# Patient Record
Sex: Male | Born: 1970 | Race: White | Hispanic: No | Marital: Married | Smoking: Current every day smoker
Health system: Southern US, Community
[De-identification: ages and names within clinical notes are randomized; demographics above are authoritative.]

## PROBLEM LIST (undated history)

## (undated) DIAGNOSIS — I4819 Other persistent atrial fibrillation: Secondary | ICD-10-CM

## (undated) DIAGNOSIS — I5022 Chronic systolic (congestive) heart failure: Secondary | ICD-10-CM

## (undated) DIAGNOSIS — E669 Obesity, unspecified: Secondary | ICD-10-CM

## (undated) DIAGNOSIS — F10231 Alcohol dependence with withdrawal delirium: Secondary | ICD-10-CM

## (undated) DIAGNOSIS — F1010 Alcohol abuse, uncomplicated: Secondary | ICD-10-CM

---

## 2015-07-27 ENCOUNTER — Ambulatory Visit
Admission: RE | Admit: 2015-07-27 | Discharge: 2015-07-27 | Disposition: A | Discharge status: Home / Self Care | Payer: Self-pay | Source: Ambulatory Visit | Attending: Cardiology | Admitting: Cardiology

## 2015-07-27 ENCOUNTER — Other Ambulatory Visit: Payer: Self-pay | Admitting: Cardiology

## 2015-07-27 DIAGNOSIS — R0602 Shortness of breath: Secondary | ICD-10-CM

## 2015-07-28 ENCOUNTER — Emergency Department (HOSPITAL_COMMUNITY): Payer: Managed Care, Other (non HMO)

## 2015-07-28 ENCOUNTER — Inpatient Hospital Stay (HOSPITAL_COMMUNITY)
Admission: EM | Admit: 2015-07-28 | Discharge: 2015-07-30 | DRG: 292 | Disposition: A | Discharge status: Home / Self Care | Payer: Managed Care, Other (non HMO) | Attending: Cardiology | Admitting: Cardiology

## 2015-07-28 ENCOUNTER — Encounter (HOSPITAL_COMMUNITY): Payer: Self-pay | Admitting: Emergency Medicine

## 2015-07-28 DIAGNOSIS — I429 Cardiomyopathy, unspecified: Secondary | ICD-10-CM | POA: Diagnosis present

## 2015-07-28 DIAGNOSIS — Z7901 Long term (current) use of anticoagulants: Secondary | ICD-10-CM | POA: Diagnosis not present

## 2015-07-28 DIAGNOSIS — I5023 Acute on chronic systolic (congestive) heart failure: Principal | ICD-10-CM | POA: Diagnosis present

## 2015-07-28 DIAGNOSIS — R000 Tachycardia, unspecified: Secondary | ICD-10-CM | POA: Diagnosis present

## 2015-07-28 DIAGNOSIS — G4700 Insomnia, unspecified: Secondary | ICD-10-CM | POA: Diagnosis present

## 2015-07-28 DIAGNOSIS — F1010 Alcohol abuse, uncomplicated: Secondary | ICD-10-CM | POA: Diagnosis present

## 2015-07-28 DIAGNOSIS — Z9114 Patient's other noncompliance with medication regimen: Secondary | ICD-10-CM

## 2015-07-28 DIAGNOSIS — E669 Obesity, unspecified: Secondary | ICD-10-CM | POA: Diagnosis present

## 2015-07-28 DIAGNOSIS — I481 Persistent atrial fibrillation: Secondary | ICD-10-CM | POA: Diagnosis present

## 2015-07-28 DIAGNOSIS — Z79899 Other long term (current) drug therapy: Secondary | ICD-10-CM | POA: Diagnosis not present

## 2015-07-28 DIAGNOSIS — Z6830 Body mass index (BMI) 30.0-30.9, adult: Secondary | ICD-10-CM | POA: Diagnosis not present

## 2015-07-28 DIAGNOSIS — F17210 Nicotine dependence, cigarettes, uncomplicated: Secondary | ICD-10-CM | POA: Diagnosis present

## 2015-07-28 DIAGNOSIS — I340 Nonrheumatic mitral (valve) insufficiency: Secondary | ICD-10-CM | POA: Diagnosis present

## 2015-07-28 DIAGNOSIS — I5022 Chronic systolic (congestive) heart failure: Secondary | ICD-10-CM | POA: Diagnosis present

## 2015-07-28 DIAGNOSIS — I4819 Other persistent atrial fibrillation: Secondary | ICD-10-CM | POA: Diagnosis present

## 2015-07-28 DIAGNOSIS — I509 Heart failure, unspecified: Secondary | ICD-10-CM

## 2015-07-28 DIAGNOSIS — K8020 Calculus of gallbladder without cholecystitis without obstruction: Secondary | ICD-10-CM | POA: Diagnosis present

## 2015-07-28 HISTORY — DX: Other persistent atrial fibrillation: I48.19

## 2015-07-28 HISTORY — DX: Obesity, unspecified: E66.9

## 2015-07-28 HISTORY — DX: Chronic systolic (congestive) heart failure: I50.22

## 2015-07-28 LAB — CBC
HCT: 46 % (ref 39.0–52.0)
HEMOGLOBIN: 15.4 g/dL (ref 13.0–17.0)
MCH: 31.1 pg (ref 26.0–34.0)
MCHC: 33.5 g/dL (ref 30.0–36.0)
MCV: 92.9 fL (ref 78.0–100.0)
PLATELETS: 188 10*3/uL (ref 150–400)
RBC: 4.95 MIL/uL (ref 4.22–5.81)
RDW: 13.4 % (ref 11.5–15.5)
WBC: 9.9 10*3/uL (ref 4.0–10.5)

## 2015-07-28 LAB — BASIC METABOLIC PANEL
ANION GAP: 14 (ref 5–15)
BUN: 8 mg/dL (ref 6–20)
CHLORIDE: 104 mmol/L (ref 101–111)
CO2: 20 mmol/L — ABNORMAL LOW (ref 22–32)
Calcium: 9.4 mg/dL (ref 8.9–10.3)
Creatinine, Ser: 1.03 mg/dL (ref 0.61–1.24)
Glucose, Bld: 104 mg/dL — ABNORMAL HIGH (ref 65–99)
POTASSIUM: 4.1 mmol/L (ref 3.5–5.1)
SODIUM: 138 mmol/L (ref 135–145)

## 2015-07-28 LAB — PROTIME-INR
INR: 1.73 — ABNORMAL HIGH (ref 0.00–1.49)
PROTHROMBIN TIME: 20.3 s — AB (ref 11.6–15.2)

## 2015-07-28 LAB — HEPATIC FUNCTION PANEL
ALBUMIN: 3.9 g/dL (ref 3.5–5.0)
ALT: 32 U/L (ref 17–63)
AST: 35 U/L (ref 15–41)
Alkaline Phosphatase: 69 U/L (ref 38–126)
BILIRUBIN DIRECT: 1 mg/dL — AB (ref 0.1–0.5)
BILIRUBIN TOTAL: 3.1 mg/dL — AB (ref 0.3–1.2)
Indirect Bilirubin: 2.1 mg/dL — ABNORMAL HIGH (ref 0.3–0.9)
Total Protein: 6.7 g/dL (ref 6.5–8.1)

## 2015-07-28 LAB — LIPASE, BLOOD: LIPASE: 21 U/L (ref 11–51)

## 2015-07-28 LAB — I-STAT TROPONIN, ED: TROPONIN I, POC: 0.03 ng/mL (ref 0.00–0.08)

## 2015-07-28 LAB — BRAIN NATRIURETIC PEPTIDE: B NATRIURETIC PEPTIDE 5: 1557.9 pg/mL — AB (ref 0.0–100.0)

## 2015-07-28 MED ORDER — MORPHINE SULFATE (PF) 4 MG/ML IV SOLN
4.0000 mg | Freq: Once | INTRAVENOUS | Status: AC
  Administered 2015-07-28: 4 mg via INTRAVENOUS
  Filled 2015-07-28: qty 1

## 2015-07-28 MED ORDER — IOPAMIDOL (ISOVUE-300) INJECTION 61%
INTRAVENOUS | Status: AC
  Administered 2015-07-28: 100 mL via INTRAVENOUS
  Filled 2015-07-28: qty 100

## 2015-07-28 MED ORDER — LISINOPRIL 2.5 MG PO TABS
2.5000 mg | ORAL_TABLET | Freq: Every day | ORAL | Status: DC
  Administered 2015-07-28 – 2015-07-29 (×2): 2.5 mg via ORAL
  Filled 2015-07-28 (×2): qty 1

## 2015-07-28 MED ORDER — DEXTROSE 5 % IV SOLN
5.0000 mg/h | INTRAVENOUS | Status: DC
  Administered 2015-07-28: 10 mg/h via INTRAVENOUS
  Filled 2015-07-28: qty 100

## 2015-07-28 MED ORDER — IOPAMIDOL (ISOVUE-300) INJECTION 61%
INTRAVENOUS | Status: AC
  Filled 2015-07-28: qty 75

## 2015-07-28 MED ORDER — FUROSEMIDE 10 MG/ML IJ SOLN
40.0000 mg | Freq: Once | INTRAMUSCULAR | Status: AC
  Administered 2015-07-28: 40 mg via INTRAVENOUS
  Filled 2015-07-28: qty 4

## 2015-07-28 MED ORDER — MORPHINE SULFATE (PF) 2 MG/ML IV SOLN: 2.0000 mg | Freq: Once | INTRAVENOUS | Status: DC

## 2015-07-28 MED ORDER — ONDANSETRON HCL 4 MG/2ML IJ SOLN
4.0000 mg | Freq: Once | INTRAMUSCULAR | Status: AC
  Administered 2015-07-28: 4 mg via INTRAVENOUS
  Filled 2015-07-28: qty 2

## 2015-07-28 MED ORDER — FUROSEMIDE 10 MG/ML IJ SOLN
40.0000 mg | Freq: Two times a day (BID) | INTRAMUSCULAR | Status: DC
  Administered 2015-07-28 – 2015-07-30 (×4): 40 mg via INTRAVENOUS
  Filled 2015-07-28 (×4): qty 4

## 2015-07-28 MED ORDER — ZOLPIDEM TARTRATE 5 MG PO TABS
5.0000 mg | ORAL_TABLET | Freq: Every evening | ORAL | Status: DC | PRN
  Administered 2015-07-28 – 2015-07-29 (×2): 5 mg via ORAL
  Filled 2015-07-28 (×2): qty 1

## 2015-07-28 MED ORDER — ACETAMINOPHEN 325 MG PO TABS: 650.0000 mg | ORAL_TABLET | ORAL | Status: DC | PRN

## 2015-07-28 MED ORDER — ONDANSETRON HCL 4 MG/2ML IJ SOLN: 4.0000 mg | Freq: Four times a day (QID) | INTRAMUSCULAR | Status: DC | PRN

## 2015-07-28 MED ORDER — METOPROLOL TARTRATE 25 MG PO TABS
25.0000 mg | ORAL_TABLET | Freq: Three times a day (TID) | ORAL | Status: DC
  Administered 2015-07-28 – 2015-07-30 (×5): 25 mg via ORAL
  Filled 2015-07-28 (×5): qty 1

## 2015-07-28 MED ORDER — DILTIAZEM LOAD VIA INFUSION
20.0000 mg | Freq: Once | INTRAVENOUS | Status: AC
  Administered 2015-07-28: 20 mg via INTRAVENOUS
  Filled 2015-07-28: qty 20

## 2015-07-28 MED ORDER — RIVAROXABAN 20 MG PO TABS
20.0000 mg | ORAL_TABLET | Freq: Every day | ORAL | Status: DC
  Administered 2015-07-28 – 2015-07-30 (×3): 20 mg via ORAL
  Filled 2015-07-28 (×3): qty 1

## 2015-07-28 MED ORDER — ONDANSETRON 4 MG PO TBDP: 8.0000 mg | ORAL_TABLET | Freq: Once | ORAL | Status: DC

## 2015-07-28 NOTE — ED Notes (Signed)
Cardiology PA at bedside for assesment

## 2015-07-28 NOTE — ED Notes (Signed)
returned from CT. Denies pain

## 2015-07-28 NOTE — ED Notes (Signed)
Franky Macho PA here and identified that patient followed by Dr. Bella Kennedy PA notified cardiologist

## 2015-07-28 NOTE — ED Notes (Signed)
Pt sts SOB and lower abd pain; pt sts SOB worse when laying down; pt sts hx of afib and sts takes meds for same

## 2015-07-28 NOTE — H&P (Signed)
History and Physical   Admit date: 07/28/2015 Name:  Juan Mathews Medical record number: [id number] DOB/Age:  1970-04-13  45 y.o. male  Referring Physician:   [facility]   Primary Cardiologist:  Donnie Aho  Primary Physician:   Dr. Leonides Sake  Chief complaint/reason for admission: Worsening shortness of breath  HPI:  This 45 year old male recently established with me for cardiac care.  I have really only seen him on one occasion previously.  He was living in [place] and was hospitalized there in May 2016.  He was found to have a nonischemic cardiomyopathy and rapid atrial fibrillation.  Cardiac catheterization in May 2016 did not show any significant coronary artery disease.  He was treated with low-dose lisinopril and carvedilol and placed on Xarelto there.  He was started on amiodarone with the idea that he might be cardioverted but felt bad after taking a dose or 2 and quit taking it.  He was separated from his wife and then decided to move back to [place] and discontinued all of his medications.  He most recently was seen for a physical and decided to go back on his medicines and went back on Xarelto about 10 days ago.  I saw him in the office on the 10th at which time he was in atrial fibrillation with rapid response.  I recommended a chest x-ray and lab and scheduled him for an echo but he did not get the lab work done until yesterday and did not start taking a higher dose of beta blocker which I had recommended.  He continued on his other medication and over the past few days has been accompanied progressively more short of breath.  He developed PND orthopnea and worsening edema with as well as shortness of breath with activity.  A note was made of fairly heavy alcohol abuse and use in [place] but he states that he has cut back since he has been here.   Past Medical History  Diagnosis Date  . Atrial fibrillation (HCC)   . CHF (congestive heart failure) (HCC)   .  Chronic systolic CHF (congestive heart failure) (HCC) 07/28/2015  . Persistent atrial fibrillation (HCC) 07/28/2015    Diagnosed 2016 in [place] went off treatment and moved here  CHA2DS2VASC score 1    . Obesity (BMI 30-39.9)      History reviewed. No pertinent past surgical history..  Allergies: has No Known Allergies.   Medications: Prior to Admission medications   Medication Sig Start Date End Date Taking? Authorizing Provider  carvedilol (COREG) 3.125 MG tablet Take 3.125 mg by mouth 2 (two) times daily with a meal.   Yes Historical Provider, MD  DiphenhydrAMINE HCl, Sleep, (ZZZQUIL) 25 MG CAPS Take 25 mg by mouth at bedtime as needed (sleep).   Yes Historical Provider, MD  diphenhydramine-acetaminophen (TYLENOL PM) 25-500 MG TABS tablet Take 1 tablet by mouth at bedtime as needed (sleep).   Yes Historical Provider, MD  lisinopril (PRINIVIL,ZESTRIL) 2.5 MG tablet Take 2.5 mg by mouth daily. 07/19/15  Yes Historical Provider, MD  XARELTO 20 MG TABS tablet Take 20 mg by mouth daily. 07/19/15  Yes Historical Provider, MD  zolpidem (AMBIEN) 10 MG tablet Take 10 mg by mouth at bedtime as needed. sleep 07/07/15  Yes Historical Provider, MD   Family History:  Family Status  Relation Status Death Age  . Father Alive   . Mother Alive   . Brother Alive     Social History:   reports that  he has been smoking.  He does not have any smokeless tobacco history on file. He reports that he drinks alcohol. He reports that he uses illicit drugs (Marijuana).   He is currently separated from his wife who lives in [place].  He works for [employer] in [employer].   Review of Systems: Difficult anxiety and stress.  He has drank fairly heavily in the past.  There is no history of GI bleeding or bleeding.  He has significant insomnia and has been using Ambien.  He does not have any significant arthritis. Other than as noted above, the remainder of the review of systems is normal  Physical  Exam: BP 133/105 mmHg  Pulse 73  Temp(Src) 97.3 F (36.3 C) (Oral)  Resp 13  SpO2 91% General appearance: Pleasant male currently in no acute distress Head: Normocephalic, without obvious abnormality, atraumatic Eyes: conjunctivae/corneas clear. PERRL, EOM's intact. Fundi not examined  Neck: no adenopathy, no carotid bruit, supple, symmetrical, trachea midline, thyroid not enlarged, symmetric, no tenderness/mass/nodules and JVD is elevated at 45 Lungs: Reduced breath sounds at the base Heart: Rapid irregular rhythm, normal S1 and S2, no S3, no murmur Abdomen: soft, non-tender; bowel sounds normal; no masses,  no organomegaly Rectal: deferred Extremities: 2+ peripheral edema, atraumatic, normal strength and tone Pulses: 2+ and symmetric Skin: Skin color, texture, turgor normal. No rashes or lesions Neurologic: Grossly normal  Labs: CBC  Recent Labs  07/28/15 0803  WBC 9.9  RBC 4.95  HGB 15.4  HCT 46.0  PLT 188  MCV 92.9  MCH 31.1  MCHC 33.5  RDW 13.4   CMP   Recent Labs  07/28/15 0803 07/28/15 0835  NA 138  --   K 4.1  --   CL 104  --   CO2 20*  --   GLUCOSE 104*  --   BUN 8  --   CREATININE 1.03  --   CALCIUM 9.4  --   PROT  --  6.7  ALBUMIN  --  3.9  AST  --  35  ALT  --  32  ALKPHOS  --  69  BILITOT  --  3.1*  GFRNONAA >60  --   GFRAA >60  --    BNP (last 3 results)    Component Value Date/Time   BNP 1557.9* 07/28/2015 0835   Cardiac Panel (last 3 results) Troponin (Point of Care Test)  Recent Labs  07/28/15 0802  TROPIPOC 0.03   EKG: Atrial fibrillation with rapid ventricular response  Radiology: Cardiomegaly without edema   IMPRESSIONS: 1.  Rapid atrial fibrillation 2.  Acute on chronic systolic congestive heart failure 3.  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  Medical noncompliance  PLAN: The patient had recently reestablished for care.  He has worsening heart failure without obvious  change and his recent medications.  He will need to have rate control and a repeat echo which has not been done yet.  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  We'll await echocardiogram.  Continue Xarelto.  I will try to rate control him and it may be an early TEE cardioversion or possibly Tikosyn may help.  He ultimately may be a candidate for catheter ablation for atrial fibrillation.  Signed: Darden Palmer MD [facility] Cardiology  07/28/2015, 3:34 PM

## 2015-07-28 NOTE — ED Notes (Signed)
Patient transported to CT 

## 2015-07-28 NOTE — Progress Notes (Signed)
New pt admission from ED. Pt brought to the floor in stable condition. Vitals taken. Initial Assessment done. All immediate pertinent needs to patient addressed. Patient Guide given to patient. Important safety instructions relating to hospitalization reviewed with patient. Patient verbalized understanding. Will continue to monitor pt.  Shai Rasmussen RN 

## 2015-07-28 NOTE — ED Provider Notes (Signed)
CSN: [id number]     Arrival date & time 07/28/15  0745 History   First MD Initiated Contact with Patient 07/28/15 [phone]     Chief Complaint  Patient presents with  . Shortness of Breath  . Palpitations  . Abdominal Pain    (Consider location/radiation/quality/duration/timing/severity/associated sxs/prior Treatment) Patient is a 45 y.o. male presenting with shortness of breath, palpitations, and abdominal pain. The history is provided by the patient.  Shortness of Breath Associated symptoms: abdominal pain   Associated symptoms: no cough, no diaphoresis, no rash and no wheezing   Palpitations Associated symptoms: shortness of breath   Associated symptoms: no cough, no diaphoresis and no dizziness   Abdominal Pain Associated symptoms: shortness of breath   Associated symptoms: no cough    45 y/o man with Hx of atrial fibrillation, heart failure, and tobacco use presents with shortness of breath and abdominal pain. His shortness of breath became severe enough that he could not sleep at all last night prompting him to come for evaluation. Lying flat causes this worsen greatly. He also notices increasing leg swelling and becomes short of breath with exertion over the past month. He is taking lisinopril and coreg for heart failure but has been off his xarelto for afib for 3-4 days due to running out of the medicine. He continue to drink alcohol regularly. He has been noticing intermittent diffuse abdominal pain and bloating for the month prior to this event but now acutely worsened. He also had several loose diarrheal stools up to 10 times this week. He denies any blood or abnormal color in these stools. He does not weigh himself regularly and know if there has been a significant change.  Past Medical History  Diagnosis Date  . Atrial fibrillation (HCC)   . CHF (congestive heart failure) (HCC)    History reviewed. No pertinent past surgical history. History reviewed. No pertinent family  history. Social History  Substance Use Topics  . Smoking status: Current Every Day Smoker  . Smokeless tobacco: None  . Alcohol Use: Yes    Review of Systems  Constitutional: Negative for diaphoresis.  HENT: Negative for congestion.   Respiratory: Positive for shortness of breath. Negative for cough and wheezing.   Cardiovascular: Positive for palpitations.  Gastrointestinal: Positive for abdominal pain.  Endocrine: Negative for polyuria.  Genitourinary: Negative for difficulty urinating.  Skin: Negative for rash.  Neurological: Negative for dizziness and light-headedness.  Psychiatric/Behavioral: The patient is not nervous/anxious.       Allergies  Review of patient's allergies indicates no known allergies.  Home Medications   Prior to Admission medications   Medication Sig Start Date End Date Taking? Authorizing Provider  carvedilol (COREG) 3.125 MG tablet Take 3.125 mg by mouth 2 (two) times daily with a meal.   Yes Historical Provider, MD  DiphenhydrAMINE HCl, Sleep, (ZZZQUIL) 25 MG CAPS Take 25 mg by mouth at bedtime as needed (sleep).   Yes Historical Provider, MD  diphenhydramine-acetaminophen (TYLENOL PM) 25-500 MG TABS tablet Take 1 tablet by mouth at bedtime as needed (sleep).   Yes Historical Provider, MD  lisinopril (PRINIVIL,ZESTRIL) 2.5 MG tablet Take 2.5 mg by mouth daily. 07/19/15  Yes Historical Provider, MD  XARELTO 20 MG TABS tablet Take 20 mg by mouth daily. 07/19/15  Yes Historical Provider, MD  zolpidem (AMBIEN) 10 MG tablet Take 10 mg by mouth at bedtime as needed. sleep 07/07/15  Yes Historical Provider, MD   BP 133/105 mmHg  Pulse 73  Temp(Src) 97.3  F (36.3 C) (Oral)  Resp 13  SpO2 91% Physical Exam  Constitutional: He is oriented to person, place, and time. He appears well-developed and well-nourished. No distress.  HENT:  Head: Atraumatic.  Mouth/Throat: Oropharynx is clear and moist.  Neck: JVD present.  Cardiovascular: An irregular rhythm  present. Tachycardia present.   Pulmonary/Chest: Breath sounds normal. No respiratory distress. He exhibits no tenderness.  Abdominal: Soft. He exhibits no distension. There is tenderness.  Musculoskeletal:  2+ pitting pedal edema b/l  Neurological: He is alert and oriented to person, place, and time.  Skin: Skin is warm and dry. He is not diaphoretic.    ED Course  Procedures (including critical care time) Labs Review Labs Reviewed  BASIC METABOLIC PANEL - Abnormal; Notable for the following:    CO2 20 (*)    Glucose, Bld 104 (*)    All other components within normal limits  HEPATIC FUNCTION PANEL - Abnormal; Notable for the following:    Total Bilirubin 3.1 (*)    Bilirubin, Direct 1.0 (*)    Indirect Bilirubin 2.1 (*)    All other components within normal limits  BRAIN NATRIURETIC PEPTIDE - Abnormal; Notable for the following:    B Natriuretic Peptide 1557.9 (*)    All other components within normal limits  PROTIME-INR - Abnormal; Notable for the following:    Prothrombin Time 20.3 (*)    INR 1.73 (*)    All other components within normal limits  CBC  LIPASE, BLOOD  I-STAT TROPOININ, ED    Imaging Review Dg Chest 2 View  07/28/2015  CLINICAL DATA:  Shortness of Breath EXAM: CHEST  2 VIEW COMPARISON:  July 27, 2015 FINDINGS: There is no edema or consolidation. There is mild cardiomegaly with pulmonary vascularity within normal limits. No adenopathy. No bone lesions. IMPRESSION: Stable cardiomegaly.  No edema or consolidation. Electronically Signed   By: Bretta Bang III M.D.   On: 07/28/2015 08:14   Dg Chest 2 View  07/27/2015  CLINICAL DATA:  Shortness of breath for several days. EXAM: CHEST  2 VIEW COMPARISON:  None. FINDINGS: Cardiomegaly with vascular congestion. No overt edema. No confluent opacities or effusions. No acute bony abnormality. IMPRESSION: Cardiomegaly with vascular congestion. Electronically Signed   By: Charlett Nose M.D.   On: 07/27/2015 11:20   Ct  Abdomen Pelvis W Contrast  07/28/2015  CLINICAL DATA:  45 year old male with 3 weeks of lower abdominal pain, diarrhea, nausea, abdominal distension. Unintentional weight gain. Elevated bilirubin. Initial encounter. EXAM: CT ABDOMEN AND PELVIS WITH CONTRAST TECHNIQUE: Multidetector CT imaging of the abdomen and pelvis was performed using the standard protocol following bolus administration of intravenous contrast. CONTRAST:  1 ISOVUE-300 IOPAMIDOL (ISOVUE-300) INJECTION 61% COMPARISON:  Chest radiographs from today and earlier. FINDINGS: Cardiomegaly. There is some reflux of right atrium contrast into a dilated hepatic IVC and hepatic veins. No pericardial effusion. Small to moderate layering right pleural effusion with mild compressive atelectasis. Minor dependent atelectasis at the left lung base. No acute osseous abnormality identified. Small volume of abdominal and pelvic free fluid with simple fluid densitometry. Mild flank and posterior body wall subcutaneous stranding compatible with subcutaneous edema. Nonspecific moderate bilateral perinephric and para renal space stranding not associated with hydronephrosis or hydroureter. Suboptimal portal venous phase of contrast on these images, but the main portal vein appears to remain patent. The IVC is mildly dilated throughout its course. Major arterial structures in the abdomen and pelvis are patent. Decompressed distal colon. There are venous  varices about the urinary bladder which otherwise appears normal. Decompressed sigmoid and left colon. Redundant but otherwise negative transverse colon. Negative right colon, decompressed terminal ileum. Appendix not delineated. No dilated small bowel. Decompressed stomach and duodenum. Decreased density throughout the liver in keeping with hepatic steatosis. No discrete liver lesion. Fatty gallstones. Fluid in the gallbladder fossa without definite gallbladder wall thickening. No intra or extrahepatic biliary ductal  enlargement. Normal splenic size. No splenic lesion. Negative pancreas and adrenal glands. Bilateral renal enhancement and contrast excretion is normal. No lymphadenopathy in the abdomen or pelvis. IMPRESSION: 1. A degree of anasarca for which cardiac versus hepatic etiology are considerations: Small to moderate layering right pleural effusion. Small volume abdominal and pelvic ascites. Body wall edema. 2. Cardiomegaly with no pericardial effusion. Some reflux of right atrium contrast into the IVC and hepatic veins may indicate a degree of right heart failure. 3. Hepatic steatosis, but no morphologic changes of cirrhosis. Consider acute hepatitis. Dilated IVC and pelvic veins with varices about the urinary bladder. 4. Bilateral perinephric stranding without evidence of renal obstruction suspicious for acute renal insufficiency. 5. Cholelithiasis. Pericholecystic fluid felt related to #1. No CT evidence of acute biliary obstruction. If acute cholecystitis is suspected clinically then right upper quadrant ultrasound versus nuclear medicine hepatobiliary scan would be recommended. Electronically Signed   By: Odessa Fleming M.D.   On: 07/28/2015 10:35   I have personally reviewed and evaluated these images and lab results as part of my medical decision-making.   EKG Interpretation   Date/Time:  Wednesday July 28 2015 07:49:08 EDT Ventricular Rate:  129 PR Interval:    QRS Duration: 80 QT Interval:  318 QTC Calculation: 465 R Axis:   0 Text Interpretation:  Atrial fibrillation with rapid ventricular response  Nonspecific T wave abnormality , probably digitalis effect Abnormal ECG No  old tracing to compare Confirmed by FLOYD MD, DANIEL [id number]) on 07/28/2015  7:55:26 AM Also confirmed by FLOYD MD, DANIEL [phone]), editor Dan Humphreys, CCT,  SANDRA ([id number])  on 07/28/2015 8:13:35 AM      MDM   Final diagnoses:  Acute on chronic congestive heart failure, unspecified congestive heart failure type (HCC)    Presentation in Afib with RVR with anasarca, JVD, and dyspnea consistent with heart failure exacerbation. BP is stable and he does not have chest pain. History, EKG, troponin not consistent with cardiac ischemia. Abdominal bloating and ascites is likely secondary to this same process, and he does not have significant hepatic dysfunction. CXR demonstrating vascular congestion and R sided pleural effusion. His heart rate responded quickly to IV diltiazem which was then discontinued in the ED. It is unclear why he has worsening heart failure and fluid retention over the past month. Case was discussed with Cardiology service and agree with plan for hospital admission.    Fuller Plan, MD 07/28/15 1502  Melene Plan, DO 07/28/15 8335

## 2015-07-28 NOTE — ED Notes (Addendum)
MD back at bedside for reassessment. HR now in 80's and cardizem drip stopped per MD request

## 2015-07-28 NOTE — ED Notes (Signed)
Attempted to call report to 3E, RN unavilable

## 2015-07-29 ENCOUNTER — Inpatient Hospital Stay (HOSPITAL_COMMUNITY): Payer: Managed Care, Other (non HMO)

## 2015-07-29 LAB — ECHOCARDIOGRAM COMPLETE
Height: 74 in
WEIGHTICAEL: 3860.8 [oz_av]

## 2015-07-29 LAB — BASIC METABOLIC PANEL
Anion gap: 12 (ref 5–15)
BUN: 6 mg/dL (ref 6–20)
CALCIUM: 8.9 mg/dL (ref 8.9–10.3)
CHLORIDE: 101 mmol/L (ref 101–111)
CO2: 26 mmol/L (ref 22–32)
CREATININE: 1.06 mg/dL (ref 0.61–1.24)
Glucose, Bld: 85 mg/dL (ref 65–99)
Potassium: 3.6 mmol/L (ref 3.5–5.1)
SODIUM: 139 mmol/L (ref 135–145)

## 2015-07-29 MED ORDER — PERFLUTREN LIPID MICROSPHERE
INTRAVENOUS | Status: AC
  Filled 2015-07-29: qty 10

## 2015-07-29 MED ORDER — SPIRONOLACTONE 25 MG PO TABS
25.0000 mg | ORAL_TABLET | Freq: Every day | ORAL | Status: DC
  Administered 2015-07-29 – 2015-07-30 (×2): 25 mg via ORAL
  Filled 2015-07-29 (×2): qty 1

## 2015-07-29 MED ORDER — PERFLUTREN LIPID MICROSPHERE
1.0000 mL | INTRAVENOUS | Status: AC | PRN
  Administered 2015-07-29: 2 mL via INTRAVENOUS
  Filled 2015-07-29: qty 10

## 2015-07-29 NOTE — Care Management Note (Signed)
Case Management Note  Patient Details  Name: Cashawn Kucera MRN: [id number] Date of Birth: 05-14-1970  Subjective/Objective:    Pt admitted with Acute on Chronic systolic heart failure                Action/Plan:  Pt is from home with girlfriend .  Pt on Xarelto PTA.  CM will continue to monitor for disposition needs   Expected Discharge Date:                  Expected Discharge Plan:  Home/Self Care  In-House Referral:     Discharge planning Services  CM Consult  Post Acute Care Choice:    Choice offered to:     DME Arranged:    DME Agency:     HH Arranged:    HH Agency:     Status of Service:  In process, will continue to follow  Medicare Important Message Given:    Date Medicare IM Given:    Medicare IM give by:    Date Additional Medicare IM Given:    Additional Medicare Important Message give by:     If discussed at Long Length of Stay Meetings, dates discussed:    Additional Comments:  Cherylann Parr, RN 07/29/2015, 2:25 PM

## 2015-07-29 NOTE — Progress Notes (Signed)
Subjective:  He has diuresed overnight and is feeling a lot better.  Weight is down 2 pounds.  Less shortness of breath since yesterday.  Was able to lay flat last night.  Objective:  Vital Signs in the last 24 hours: BP 127/77 mmHg  Pulse 80  Temp(Src) 97.6 F (36.4 C) (Oral)  Resp 18  Ht 6\' 2"  (1.88 m)  Wt 109.453 kg (241 lb 4.8 oz)  BMI 30.97 kg/m2  SpO2 96%  Physical Exam: Pleasant male in no acute distress Lungs:  Clear  Cardiac:  Regular rhythm, normal S1 and S2, no S3 Abdomen:  Soft, nontender, no masses Extremities:  1+ edema present  Intake/Output from previous day: 04/19 0701 - 04/20 0700 In: 240 [P.O.:240] Out: 3925 [Urine:3925] Weight Filed Weights   07/28/15 1700 07/29/15 0530  Weight: 110.36 kg (243 lb 4.8 oz) 109.453 kg (241 lb 4.8 oz)    Lab Results: Basic Metabolic Panel:  Recent Labs  11/22/46 0803 07/29/15 0233  NA 138 139  K 4.1 3.6  CL 104 101  CO2 20* 26  GLUCOSE 104* 85  BUN 8 6  CREATININE 1.03 1.06    CBC:  Recent Labs  07/28/15 0803  WBC 9.9  HGB 15.4  HCT 46.0  MCV 92.9  PLT 188    BNP    Component Value Date/Time   BNP 1557.9* 07/28/2015 0835    PROTIME: Lab Results  Component Value Date   INR 1.73* 07/28/2015    Telemetry: Atrial fibrillation rate is currently controlled  Assessment/Plan:  1.  Acute on chronic systolic congestive heart failure 2.  Nonischemic cardiomyopathy 3.  Persistent atrial fibrillation currently under better control 4.  Long-term use of anticoagulation  Recommendations:  Await echocardiogram done today.  Continue intravenous diuresis.  Add spironolactone.  At this point in leaning towards treatment of heart failure and completion of 4 weeks of anticoagulation prior to cardioversion.     Darden Palmer  MD [facility] Cardiology  07/29/2015, 9:11 AM

## 2015-07-29 NOTE — Progress Notes (Signed)
  Echocardiogram 2D Echocardiogram has been performed.  Juan Mathews 07/29/2015, 1:24 PM

## 2015-07-30 ENCOUNTER — Inpatient Hospital Stay (HOSPITAL_COMMUNITY): Payer: Managed Care, Other (non HMO)

## 2015-07-30 DIAGNOSIS — I481 Persistent atrial fibrillation: Secondary | ICD-10-CM

## 2015-07-30 DIAGNOSIS — I5023 Acute on chronic systolic (congestive) heart failure: Principal | ICD-10-CM

## 2015-07-30 LAB — BASIC METABOLIC PANEL
Anion gap: 12 (ref 5–15)
BUN: 6 mg/dL (ref 6–20)
CALCIUM: 9 mg/dL (ref 8.9–10.3)
CHLORIDE: 98 mmol/L — AB (ref 101–111)
CO2: 31 mmol/L (ref 22–32)
CREATININE: 1.04 mg/dL (ref 0.61–1.24)
Glucose, Bld: 138 mg/dL — ABNORMAL HIGH (ref 65–99)
Potassium: 3.2 mmol/L — ABNORMAL LOW (ref 3.5–5.1)
SODIUM: 141 mmol/L (ref 135–145)

## 2015-07-30 LAB — TSH: TSH: 1.562 u[IU]/mL (ref 0.350–4.500)

## 2015-07-30 MED ORDER — METOPROLOL SUCCINATE ER 50 MG PO TB24
50.0000 mg | ORAL_TABLET | Freq: Two times a day (BID) | ORAL | Status: DC
  Administered 2015-07-30: 50 mg via ORAL
  Filled 2015-07-30: qty 1

## 2015-07-30 MED ORDER — POTASSIUM CHLORIDE 20 MEQ/15ML (10%) PO SOLN
40.0000 meq | Freq: Once | ORAL | Status: AC
  Administered 2015-07-30: 40 meq via ORAL
  Filled 2015-07-30: qty 30

## 2015-07-30 MED ORDER — METOPROLOL SUCCINATE ER 25 MG PO TB24: 25.0000 mg | ORAL_TABLET | Freq: Two times a day (BID) | ORAL | Status: DC

## 2015-07-30 MED ORDER — SPIRONOLACTONE 25 MG PO TABS: 25.0000 mg | ORAL_TABLET | Freq: Every day | ORAL | Status: DC

## 2015-07-30 MED ORDER — FUROSEMIDE 40 MG PO TABS: 40.0000 mg | ORAL_TABLET | Freq: Every day | ORAL | Status: AC

## 2015-07-30 MED ORDER — FUROSEMIDE 40 MG PO TABS
40.0000 mg | ORAL_TABLET | Freq: Every day | ORAL | Status: DC
  Administered 2015-07-30: 40 mg via ORAL
  Filled 2015-07-30: qty 1

## 2015-07-30 MED ORDER — METOPROLOL SUCCINATE ER 50 MG PO TB24: 50.0000 mg | ORAL_TABLET | Freq: Two times a day (BID) | ORAL | Status: DC

## 2015-07-30 MED ORDER — LISINOPRIL 2.5 MG PO TABS
2.5000 mg | ORAL_TABLET | Freq: Every day | ORAL | Status: DC
  Administered 2015-07-30: 2.5 mg via ORAL
  Filled 2015-07-30: qty 1

## 2015-07-30 NOTE — Progress Notes (Signed)
Subjective:  He is clinically feeling a lot better.  Weight is now down 11 pounds.  No shortness of breath or chest pain.  Echo yesterday shows markedly dilated left atrium and dilated LV with an EF of 10-15% which is down by report from the echo done in [place] last year.  Again reviewed his history.  He drank a lot around the time of the first echo but has not had excessive alcohol use recently.  No longer has PND or orthopnea but normally has a lot of trouble with insomnia.  Objective:  Vital Signs in the last 24 hours: BP 108/84 mmHg  Pulse 88  Temp(Src) 97.9 F (36.6 C) (Oral)  Resp 17  Ht 6\' 2"  (1.88 m)  Wt 105.507 kg (232 lb 9.6 oz)  BMI 29.85 kg/m2  SpO2 96%  Physical Exam: Pleasant male in no acute distress Lungs:  Clear  Cardiac:  Regular rhythm, normal S1 and S2, no S3 Abdomen:  Soft, nontender, no masses Extremities: No edema present  Intake/Output from previous day: 04/20 0701 - 04/21 0700 In: 960 [P.O.:960] Out: 3450 [Urine:3450] Weight Filed Weights   07/28/15 1700 07/29/15 0530 07/30/15 0507  Weight: 110.36 kg (243 lb 4.8 oz) 109.453 kg (241 lb 4.8 oz) 105.507 kg (232 lb 9.6 oz)    Lab Results: Basic Metabolic Panel:  Recent Labs  62/37/62 0233 07/30/15 0357  NA 139 141  K 3.6 3.2*  CL 101 98*  CO2 26 31  GLUCOSE 85 138*  BUN 6 6  CREATININE 1.06 1.04    CBC:  Recent Labs  07/28/15 0803  WBC 9.9  HGB 15.4  HCT 46.0  MCV 92.9  PLT 188    BNP    Component Value Date/Time   BNP 1557.9* 07/28/2015 0835    PROTIME: Lab Results  Component Value Date   INR 1.73* 07/28/2015    Telemetry: Atrial fibrillation rate is currently controlled  Assessment/Plan:  1.  Acute on chronic systolic congestive heart failure clinically better with 11 pound diuresis 2.  Nonischemic cardiomyopathy 3.  Persistent atrial fibrillation currently under better control 4.  Long-term use of anticoagulation  Recommendations:  Very concerned about the  drop in ejection fraction even from last year.  He did go a period of time with no medications until recently and has only been back on Xarelto the past 10 days. Because of his age and the quite low EF am going to ask the advanced heart failure physicians to consult on him.  He is currently on low-dose lisinopril and this will need to be stopped so we can change him to [facility].  He is on metoprolol and we'll change this to the extended release form and also on spironolactone.He is anxious for discharge so I think we could start the interested was an outpatient.  With the size of his left atrium I'm not sure his chances of maintaining sinus rhythm long-term will be good.   Darden Palmer  MD [facility] Cardiology  07/30/2015, 7:54 AM

## 2015-07-30 NOTE — Progress Notes (Signed)
Patient is discharge to home accompanied by RN. Discharge instructions given . Patient verbalizes understanding. All personal belongings given. Telemetry box and IV removed prior to discharge and site in good condition.

## 2015-07-30 NOTE — Consult Note (Signed)
Advanced Heart Failure Team Consult Note  Referring Physician: Dr Juan Mathews  Primary Physician: Dr Juan Mathews Primary Cardiologist:  Dr Juan Mathews   Reason for Consultation: Heart Failure   HPI:    [name] is a 45 year old with a history of chronic systolic heart failure diagnosed May 2016, NICM had LHC in [place], A Fib, and former ETOH abuse (1 bottle of wine nightly cut back in 2016).  Relocated from [place] to [place] about 6 months ago. He has been off his medications for several months.   Over the last month he noticed increased dyspnea with exertion and fatigue. He was evaluated by PCP and xarelto was restarted and he was referred to Dr Juan Mathews. At that time he was started back on low dose ace and bb. Unfortunately he had progressive dyspnea on exertion so he presented to [facility] ED for further evaluation.   Admitted with increased dyspnea and A fib RVR.  He has been diuresing with IV lasix. Started on Toprol XL for rate control, xarelto, and Spiro. Overall he has been diuresed 11 pounds.   Overall he is feeling better. Denies SOB.     ECHO: EF 10-15% LA severely dilated. Mod MR RA moderately dilated. Severe Hypokinesis  SH: Does not smoke. Lives alone. 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: Negative for HF /CAD   Review of Systems: [y] = yes,  = no   General: Weight gain ; Weight loss ; Anorexia ; Fatigue [Y ]; Fever ; Chills ; Weakness [ Y]  Cardiac: Chest pain/pressure ; Resting SOB ; Exertional SOB [Y ]; Orthopnea ; Pedal Edema ; Palpitations ; Syncope ; Presyncope ; Paroxysmal nocturnal dyspnea[ ]   Pulmonary: Cough ; Wheezing[ ] ; Hemoptysis[ ] ; Sputum ; Snoring   GI: Vomiting[ ] ; Dysphagia[ ] ; Melena[ ] ; Hematochezia ; Heartburn[ ] ; Abdominal pain ; Constipation ; Diarrhea ; BRBPR   GU: Hematuria[ ] ; Dysuria ; Nocturia[ ]   Vascular: Pain in legs with walking ; Pain in feet with lying flat ; Non-healing sores ; Stroke ; TIA ;  Slurred speech ;  Neuro: Headaches[ ] ; Vertigo[ ] ; Seizures[ ] ; Paresthesias[ ] ;Blurred vision ; Diplopia ; Vision changes   Ortho/Skin: Arthritis ; Joint pain ; Muscle pain ; Joint swelling ; Back Pain ; Rash   Psych: Depression[ ] ; Anxiety[ ]   Heme: Bleeding problems ; Clotting disorders ; Anemia   Endocrine: Diabetes ; Thyroid dysfunction[ ]   Home Medications Prior to Admission medications   Medication Sig Start Date End Date Taking? Authorizing Provider  carvedilol (COREG) 3.125 MG tablet Take 3.125 mg by mouth 2 (two) times daily with a meal.   Yes Historical Provider, MD  DiphenhydrAMINE HCl, Sleep, (ZZZQUIL) 25 MG CAPS Take 25 mg by mouth at bedtime as needed (sleep).   Yes Historical Provider, MD  diphenhydramine-acetaminophen (TYLENOL PM) 25-500 MG TABS tablet Take 1 tablet by mouth at bedtime as needed (sleep).   Yes Historical Provider, MD  lisinopril (PRINIVIL,ZESTRIL) 2.5 MG tablet Take 2.5 mg by mouth daily. 07/19/15  Yes Historical Provider, MD  XARELTO 20 MG TABS tablet Take 20 mg by mouth daily. 07/19/15  Yes Historical Provider, MD  zolpidem (AMBIEN) 10 MG tablet Take 10 mg by mouth at bedtime as needed. sleep 07/07/15  Yes Historical Provider,  MD   Scheduled Meds: . furosemide  40 mg Oral Daily  . lisinopril  2.5 mg Oral Daily  . metoprolol succinate  50 mg Oral BID WC  . rivaroxaban  20 mg Oral Q supper  . spironolactone  25 mg Oral Daily   Continuous Infusions:  PRN Meds:.acetaminophen, ondansetron (ZOFRAN) IV, zolpidem   Past Medical History: Past Medical History  Diagnosis Date  . Atrial fibrillation (HCC)   . CHF (congestive heart failure) (HCC)   . Chronic systolic CHF (congestive heart failure) (HCC) 07/28/2015  . Persistent atrial fibrillation (HCC) 07/28/2015    Diagnosed 2016 in [place] went off treatment and moved here  CHA2DS2VASC score 1    . Obesity (BMI 30-39.9)     Past Surgical History: History  reviewed. No pertinent past surgical history.  Family History: History reviewed. No pertinent family history.  Social History: Social History   Social History  . Marital Status: Married    Spouse Name: N/A  . Number of Children: N/A  . Years of Education: N/A   Social History Main Topics  . Smoking status: Current Every Day Smoker  . Smokeless tobacco: None  . Alcohol Use: 0.0 oz/week    0 Standard drinks or equivalent per week     Comment: Heavy in [ast   . Drug Use: Yes    Special: Marijuana  . Sexual Activity: Not Asked   Other Topics Concern  . None   Social History Narrative  . None    Allergies:  No Known Allergies  Objective:    Vital Signs:   Temp:  [97.4 F (36.3 C)-97.9 F (36.6 C)] 97.9 F (36.6 C) (04/21 0507) Pulse Rate:  [67-95] 88 (04/21 0507) Resp:  [17-18] 17 (04/21 0507) BP: (101-121)/(68-86) 108/84 mmHg (04/21 0507) SpO2:  [93 %-97 %] 96 % (04/21 0507) Weight:  [232 lb 9.6 oz (105.507 kg)] 232 lb 9.6 oz (105.507 kg) (04/21 0507) Last BM Date: 07/28/15  Weight change: Filed Weights   07/28/15 1700 07/29/15 0530 07/30/15 0507  Weight: 243 lb 4.8 oz (110.36 kg) 241 lb 4.8 oz (109.453 kg) 232 lb 9.6 oz (105.507 kg)    Intake/Output:   Intake/Output Summary (Last 24 hours) at 07/30/15 1132 Last data filed at 07/30/15 0937  Gross per 24 hour  Intake   1560 ml  Output   3100 ml  Net  -1540 ml     Physical Exam: General:  Well appearing. No resp difficulty HEENT: normal Neck: supple. JVP 8-9 . Carotids 2+ bilat; no bruits. No lymphadenopathy or thryomegaly appreciated. Cor: PMI nondisplaced. Irregular Regular rate & rhythm. No rubs, gallops or murmurs. Lungs: clear Abdomen: soft, nontender, nondistended. No hepatosplenomegaly. No bruits or masses. Good bowel sounds. Extremities: no cyanosis, clubbing, rash, edema Neuro: alert & orientedx3, cranial nerves grossly intact. moves all 4 extremities w/o difficulty. Affect  pleasant  Telemetry: A fib 100s   Labs: Basic Metabolic Panel:  Recent Labs Lab 07/28/15 0803 07/29/15 0233 07/30/15 0357  NA 138 139 141  K 4.1 3.6 3.2*  CL 104 101 98*  CO2 20* 26 31  GLUCOSE 104* 85 138*  BUN 8 6 6   CREATININE 1.03 1.06 1.04  CALCIUM 9.4 8.9 9.0    Liver Function Tests:  Recent Labs Lab 07/28/15 0835  AST 35  ALT 32  ALKPHOS 69  BILITOT 3.1*  PROT 6.7  ALBUMIN 3.9    Recent Labs Lab 07/28/15 0835  LIPASE 21   No results for input(s):  AMMONIA in the last 168 hours.  CBC:  Recent Labs Lab 07/28/15 0803  WBC 9.9  HGB 15.4  HCT 46.0  MCV 92.9  PLT 188    Cardiac Enzymes: No results for input(s): CKTOTAL, CKMB, CKMBINDEX, TROPONINI in the last 168 hours.  BNP: BNP (last 3 results)  Recent Labs  07/28/15 0835  BNP 1557.9*    ProBNP (last 3 results) No results for input(s): PROBNP in the last 8760 hours.   CBG: No results for input(s): GLUCAP in the last 168 hours.  Coagulation Studies:  Recent Labs  07/28/15 1118  LABPROT 20.3*  INR 1.73*    Other results: EKG: IN the ED A -Fib 129 bpm  Imaging:  No results found.   Medications:     Current Medications: . furosemide  40 mg Oral Daily  . metoprolol succinate  25 mg Oral BID WC  . rivaroxaban  20 mg Oral Q supper  . spironolactone  25 mg Oral Daily     Infusions:      Assessment/Plan/Discussion  [name] is a 45 year old admitted with ADHF and RVR.   1. A/C Systolic HF - NICM  2016--? Tachy mediated versus ETOH. LHC in [place] 2016 no blockages.  NYHA II. Volume ok. Continue lasix 40 mg daily + 25 mg spironolactone  Increase metoprolol XL to 50 mg twice a day. Add 2. 5 mg lisinopril  BMET in am. Renal function has been stable.  Check TSH HIV.  2. A fib RVR- on metoprolol XL. Increasing as above.  Has not had cardioversion in the past. Left atrium dilated concern he may not hold NSR. Continue xarelto 20 mg daily.   3. H/O ETOH abuse-  Discussed he needs to stop drinking all together.   Consult cardiac rehab.   Follow up with Dr Shirlee Latch May 4th 10:30. Will set up referral with [facility] pharmacy for tikosyn May 5th at 10:00. Anticipate TEE in 3 weeks.   Length of Stay: 2  Amy Clegg NP-C  07/30/2015, 11:32 AM  Advanced Heart Failure Team Pager [phone] (M-F; 7a - 4p)  Please contact [facility] for night-coverage after hours (4p -7a ) and weekends on [email or web address]  Patient seen with NP, agree with the above note.   Patient has had a cardiomyopathy diagnosed since 2016 in setting of atrial fibrillation with RVR as well as heavy ETOH use.  He was lost to cardiology followup for around a year.  Admitted with acute on chronic systolic CHF, EF 45%.  1. Acute on chronic systolic CHF: Nonischemic cardiomyopathy, normal coronaries in 5/16.  Possible tachycardia-mediated cardiomyopathy.  ETOH certainly may play a role as he drank heavily until about 3 weeks ago. Viral myocarditis less likely but possible. No family history of cardiomyopathy.  He has diuresed well in the hospital and looks euvolemic.  Dyspnea has resolved.  - HR still running high in atrial fibrillation, would increase Toprol XL to 50 mg bid.  - Start lisinopril 2.5 mg daily.  Would hold off on Entresto for now with BP on the lower side.  Would like to transition to [facility] eventually.  - Continue spironolactone.  - Reasonable to send him home on Lasix 40 mg po daily for now, will see back to adjust.  - Will arrange for cardiac MRI to look for infiltrative disease.  - Check TSH and HIV.  - I think that he needs to get out of atrial fibrillation if at all possible given concern for tachy-mediated CMP.  See below. - He may need advanced therapies down the road, will need close followup.   2. Atrial fibrillation: Still with mild RVR.  Possible tachy-mediated CMP.  Only on Xarelto for about 10 days now.  Need to try to get him back in NSR.   - Increase Toprol XL for  better rate control.  - Continue Xarelto.  - We discussed starting dofetilide for rhythm control.  If we were to start it now, he would need TEE prior as he has been on Xarelto < 3 wks and has been in persistent atrial fibrillation.  At this point, TEE would not be able to be done until Monday and he would need 3 days on dofetilide in hospital afterwards.  He prefers to go home, complete 1 month of Xarelto and then have elective admission for Tikosyn load.  This is reasonable.  Will repeat ECG today to follow QTc. I am concerned about his ability to hold NSR long-term based on the size of his atria.  At some point, will need to involve EP for consideration of afib ablation.  3. H/o ETOH abuse: Encouraged complete abstinence.   [name] 07/30/2015 12:49 PM

## 2015-07-30 NOTE — Progress Notes (Signed)
Patient HR 108-140's during activity. MD notified. Metoprolol 50 mg given as scheduled. . Will continue to monitor.

## 2015-07-30 NOTE — Discharge Summary (Signed)
Physician Discharge Summary  Patient ID: Juan Mathews MRN: [id number] DOB/AGE: [birth year] 45 y.o.  Admit date: 07/28/2015 Discharge date: 07/30/2015  Primary Physician:  Dr. Leonides Sake  Primary Discharge Diagnosis:  1. Acute on chronic systolic congestive heart failure  Secondary Discharge Diagnosis: 2. Chronic systolic heart failure 3. 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 Cholelithiasis 5. Long-term use of anticoagulation 6. Atrial fibrillation with rapid ventricular response  Procedures:  ECHO  Consults:  Dr. [name] Advanced CHF  Hospital Course: This 45 year old male recently established with me for cardiac care. I have really only seen him on one occasion previously. He was living in [place] and was hospitalized there in May 2016. He was found to have a nonischemic cardiomyopathy and rapid atrial fibrillation. Cardiac catheterization in May 2016 did not show any significant coronary artery disease. He was treated with low-dose lisinopril and carvedilol and placed on Xarelto there. He was started on amiodarone with the idea that he might be cardioverted but felt bad after taking a dose or 2 and quit taking it. He was separated from his wife and then decided to move back to [place] and discontinued all of his medications. He most recently was seen for a physical and decided to go back on his medicines and went back on Xarelto about 10 days ago. I saw him in the office on the 10th at which time he was in atrial fibrillation with rapid response. I recommended a chest x-ray and lab and scheduled him for an echo but he did not get the lab work done until yesterday and did not start taking a higher dose of beta blocker which I had recommended. He continued on his other medication and over the past few days has been accompanied progressively more short of breath. He developed PND orthopnea and worsening edema with as well as shortness of breath with activity. A  note was made of fairly heavy alcohol abuse and use in [place] but he states that he has cut back since he has been here.  The patient was admitted with congestive heart failure and atrial fibrillation with rapid response. He was given diltiazem in the emergency room intravenously which slowed his heart rate. A CT scan done in the emergency room showed cholelithiasis and some perinephric stranding but no other acute abnormality. He was taken off of carvedilol and was changed to metoprolol initially. He was diuresed with intravenous Lasix 40 mg twice daily and had an 11 pound weight loss, losing from 243-232 . His breathing markedly improved. He was started on spironolactone. His lisinopril was initially discontinued but the thought that he might be switched to [facility]. An echocardiogram showed a markedly reduced ejection fraction of 10-15% with markedly dilated left atrium. He has severe global hypokinesis with moderate to severe mitral regurgitation and mild elevation of pulmonary artery pressures.  These findings were discussed with the patient. It was thought unlikely that he would be up maintain sinus rhythm with the size of his atrium. He was seen by Dr. Shirlee Latch of advanced heart failure who felt his blood pressure was too low to consider Entresto did think that a trial should be made of Tikosyn they would need to be started after he had been on anticoagulation for at least a month. He will make arrangements to do this in the future. Cardioversion could be considered. He recommended increasing his metoprolol to 50 mg twice daily. An attempt was made to get a cardiac MRI the afternoon of discharge the patient was  unable to tolerate it so the procedure was terminated. The patient very much wished to go home and Dr. Shirlee Latch felt it would be safe for him to be discharged so he is discharged on the medicines below. He is instructed to restrict his fluids to 2 quarts daily and to weigh daily and report weight  gain. He is to abstain from alcohol. I will see him in followup in one week and he is to call if there are problems. His potassium was repleted the day of discharge and he will not be discharged on potassium since he was just started on spironolactone but this will be followed up as an outpatient.    Discharge Exam: Blood pressure 123/84, pulse 108, temperature 97.9 F (36.6 C), temperature source Oral, resp. rate 18, height  (1.88 m), weight 105.507 kg (232 lb 9.6 oz), SpO2 98 %. Weight: 105.507 kg (232 lb 9.6 oz)  Labs: CBC:   Lab Results  Component Value Date   WBC 9.9 07/28/2015   HGB 15.4 07/28/2015   HCT 46.0 07/28/2015   MCV 92.9 07/28/2015   PLT 188 07/28/2015    CMP:  Recent Labs Lab 07/28/15 0835  07/30/15 0357  NA  --   < > 141  K  --   < > 3.2*  CL  --   < > 98*  CO2  --   < > 31  BUN  --   < > 6  CREATININE  --   < > 1.04  CALCIUM  --   < > 9.0  PROT 6.7  --   --   BILITOT 3.1*  --   --   ALKPHOS 69  --   --   ALT 32  --   --   AST 35  --   --   GLUCOSE  --   < > 138*  < > = values in this interval not displayed.  BNP (last 3 results) BNP    Component Value Date/Time   BNP 1557.9* 07/28/2015 0835   Thyroid: Lab Results  Component Value Date   TSH 1.562 07/30/2015    Radiology: Cardiomegaly, no edema or consolidation  EKG: Atrial fibrillation with RVR, nonspecific ST changes  Discharge Medications:   Medication List    STOP taking these medications        carvedilol 3.125 MG tablet  Commonly known as:  COREG     diphenhydramine-acetaminophen 25-500 MG Tabs tablet  Commonly known as:  TYLENOL PM      TAKE these medications        furosemide 40 MG tablet  Commonly known as:  LASIX  Take 1 tablet (40 mg total) by mouth daily.     lisinopril 2.5 MG tablet  Commonly known as:  PRINIVIL,ZESTRIL  Take 2.5 mg by mouth daily.     metoprolol succinate 50 MG 24 hr tablet  Commonly known as:  TOPROL-XL  Take 1 tablet (50 mg total)  by mouth 2 (two) times daily with a meal. Take with or immediately following a meal.     spironolactone 25 MG tablet  Commonly known as:  ALDACTONE  Take 1 tablet (25 mg total) by mouth daily.     XARELTO 20 MG Tabs tablet  Generic drug:  rivaroxaban  Take 20 mg by mouth daily.     zolpidem 10 MG tablet  Commonly known as:  AMBIEN  Take 10 mg by mouth at bedtime as needed. sleep  ZZZQUIL 25 MG Caps  Generic drug:  DiphenhydrAMINE HCl (Sleep)  Take 25 mg by mouth at bedtime as needed (sleep).        Followup plans and appointments: Dr. Donnie Aho one week,  Dr. Shirlee Latch May 5th   Time spent with patient to include physician time: 75 Signed: W. Ashley Royalty. MD [facility] 07/30/2015, 4:43 PM

## 2015-07-30 NOTE — Progress Notes (Signed)
CARDIAC REHAB PHASE I   PRE:  Rate/Rhythm: 107 afib    BP: sitting 123/86    SaO2: 99 RA  MODE:  Ambulation: 380 ft   POST:  Rate/Rhythm: 150 afib    BP: sitting 125/86     SaO2: 98 RA  Pt thankful to walk however his HR increased to 140s and 150 max after 300 ft. Pt asx. Return to room. Pt really wants to exercise, he was going to gym and lifting weights before admit. However I do not feel comfortable giving walking guidelines without his HR being controlled. I encouraged him to discuss ex with his cardiologist. We did discuss CRPII and I will refer him to [place] for after all of his tests. Reviewed low sodium, daily wts, and smoking cessation. He is very receptive but does seem stressed. He plans to quit smoking. Gave fake cig and resources. Pt is knowledgeable re: diet, etc.  [phone]   Harriet Masson CES, ACSM 07/30/2015 3:14 PM

## 2015-07-30 NOTE — Care Management Note (Signed)
Case Management Note  Patient Details  Name: Juan Mathews MRN: [id number] Date of Birth: 1970/05/06  Subjective/Objective:    Pt admitted with acute on chronic congestive HF                Action/Plan:  PTA- independent.  Pt will discharge home back on Xarelto.  NO CM needs determined   Expected Discharge Date:                  Expected Discharge Plan:  Home/Self Care  In-House Referral:     Discharge planning Services  CM Consult  Post Acute Care Choice:    Choice offered to:     DME Arranged:    DME Agency:     HH Arranged:    HH Agency:     Status of Service:  Completed, signed off  Medicare Important Message Given:    Date Medicare IM Given:    Medicare IM give by:    Date Additional Medicare IM Given:    Additional Medicare Important Message give by:     If discussed at Long Length of Stay Meetings, dates discussed:    Additional Comments:  Cherylann Parr, RN 07/30/2015, 4:30 PM

## 2015-07-31 LAB — HIV ANTIBODY (ROUTINE TESTING W REFLEX): HIV SCREEN 4TH GENERATION: NONREACTIVE

## 2015-08-06 ENCOUNTER — Encounter: Payer: Self-pay | Admitting: Cardiology

## 2015-08-06 NOTE — Progress Notes (Signed)
Patient ID: Juan Mathews, male   DOB: [birth year], 45 y.o.   MRN: [id number]  Juan Mathews    Date of visit:  08/06/2015 DOB:  [birth year]    Age:  45 yrs. Medical record number:  [id number]     Account number:  [id number] Primary Care Provider: Dionicia Mathews ____________________________ CURRENT DIAGNOSES  1. Cardiomyopathy  2. Persistent atrial fibrillation  3. Chronic systolic heart failure  4. Long term (current) use of anticoagulants  5. Obesity ____________________________ ALLERGIES  No Known Drug Allergies ____________________________ MEDICATIONS  1. zolpidem 10 mg tablet, PRN  2. lisinopril 2.5 mg tablet, 1 p.o. daily  3. Xarelto 20 mg tablet, 1 p.o. daily  4. metoprolol succinate ER 50 mg tablet,extended release 24 hr, BID  5. furosemide 40 mg tablet, 1 p.o. daily  6. spironolactone 25 mg tablet, 1 p.o. daily ____________________________ CHIEF COMPLAINTS  Followup of Persistent atrial fibrillation ____________________________ HISTORY OF PRESENT ILLNESS  Patient seen for hospital followup. He was admitted to the hospital with worsening of systolic heart failure. He was seen by the heart failure pain when he was in there. He had an echo showing an EF of 10-15% with a markedly dilated left atrium. He was diuresed and continues to be diuresed since discharge. He had recently gone back on his medications. There was a question of whether he had a tachycardia induced cardiomyopathy previously. Because of slowness of his ejection fraction recommended that he be seen by the heart failure team and they're planning to start him on Tikosyn and consider cardioversion. He feels better today and is not currently short of breath. His edema is down. He has lost 25 pounds of weight since he was seen earlier. He does not currently have edema. He is currently on optimal medical therapy. ____________________________ PAST HISTORY  Past Medical Illnesses:  obesity, denies hypertension or diabetes;   Cardiovascular Illnesses:  atrial fibrillation;  Infectious Diseases:  no previous history of significant infectious diseases;  Surgical Procedures:  no previous surgical procedures;  Trauma History:  no previous history of significant trauma;  NYHA Classification:  II;  Canadian Angina Classification:  Class 0: Asymptomatic;  Cardiology Procedures-Invasive:  cardiac cath (left) May 2016;  Cardiology Procedures-Noninvasive:  echocardiogram April 2017;  Cardiac Cath Results:  no significant disease;  Peripheral Vascular Procedures:  no previous invasive peripheral vascular procedures.;  LVEF of 15% documented via echocardiogram on 07/29/2015,   ____________________________ CARDIO-PULMONARY TEST DATES Cardiac Cath Date:  09/01/2014;  Echocardiography Date: 07/29/2015;   ____________________________ FAMILY HISTORY Juan Mathews -- Juan Mathews alive and well Juan Mathews -- Juan Mathews alive and well Juan Mathews -- Juan Mathews alive and well ____________________________ SOCIAL HISTORY Alcohol Use:  occasionally;  Smoking:  used to smoke but quit;  Diet:  regular diet;  Occupation:  works for [employer];  Residence:  lives alone;   ____________________________ REVIEW OF SYSTEMS General:  weight loss of approximately 20 lbs Eyes: wears eye glasses/contact lenses, denies diplopia, glaucoma or visual field defects. Respiratory: dyspnea with exertion Cardiovascular:  please review HPI Abdominal: abdominal bloating Genitourinary-Male: no dysuria, urgency, frequency, or nocturia Neurological:  denies headaches, stroke, or TIA Psychiatric:  situational stress, insomnia  ____________________________ PHYSICAL EXAMINATION VITAL SIGNS  Blood Pressure:  90/60 Sitting, Left arm, regular cuff  , 90/70 Standing, Left arm and regular cuff   Pulse:  100/min. Weight:  222.00 lbs. Height:  74"BMI: 28  Constitutional:  pleasant white male in no acute distress, mildly obese Skin:  warm and dry to touch, no apparent skin lesions, or masses  noted. Head:   normocephalic, normal hair pattern, no masses or tenderness Neck:  supple, without massess. No JVD, thyromegaly or carotid bruits. Carotid upstroke normal. Chest:  normal symmetry, clear to auscultation. Cardiac:  rapid irregularly irregular rhythm, normal S1 and S2, no S3 or S4, no murmurs,clicks, or rub heard Peripheral Pulses:  the femoral,dorsalis pedis, and posterior tibial pulses are full and equal bilaterally with no bruits auscultated. Extremities & Back:  no deformities, clubbing, cyanosis, erythema or edema observed. Normal muscle strength and tone. Neurological:  no gross motor or sensory deficits noted, affect appropriate, oriented x3. ____________________________ IMPRESSIONS/PLAN  1. Nonischemic cardiomyopathy with markedly depressed ejection fraction 2. Chronic atrial fibrillation with rapid response 3. Arch the dilated atrium 4. Long-term use of anticoagulation  Recommendations:  He is seeing Juan Mathews next week and will let them manage the Tikosyn and try to get him back in sinus rhythm. At this point I'm concerned about his ability to maintain sinus rhythm due to the size of his left atrium. If his ventricle does not improve he may need to be considered for more advanced therapies for heart failure. I will see him back in followup in one month.  ____________________________ TODAYS ORDERS  1. Basic Metabolic Panel: Today  2. Return Visit: 1 month                       ____________________________ Cardiology Physician:  Juan Palmer MD [facility]

## 2015-08-12 ENCOUNTER — Encounter (HOSPITAL_COMMUNITY): Payer: Self-pay

## 2015-08-12 ENCOUNTER — Ambulatory Visit (HOSPITAL_COMMUNITY)
Admit: 2015-08-12 | Discharge: 2015-08-12 | Disposition: A | Discharge status: Home / Self Care | Payer: Managed Care, Other (non HMO) | Source: Ambulatory Visit | Attending: Cardiology | Admitting: Cardiology

## 2015-08-12 VITALS — BP 118/70 | HR 93 | Wt 220.0 lb

## 2015-08-12 DIAGNOSIS — I428 Other cardiomyopathies: Secondary | ICD-10-CM | POA: Diagnosis not present

## 2015-08-12 DIAGNOSIS — I481 Persistent atrial fibrillation: Secondary | ICD-10-CM

## 2015-08-12 DIAGNOSIS — I2510 Atherosclerotic heart disease of native coronary artery without angina pectoris: Secondary | ICD-10-CM | POA: Diagnosis not present

## 2015-08-12 DIAGNOSIS — I5022 Chronic systolic (congestive) heart failure: Secondary | ICD-10-CM | POA: Diagnosis not present

## 2015-08-12 DIAGNOSIS — Z7901 Long term (current) use of anticoagulants: Secondary | ICD-10-CM | POA: Insufficient documentation

## 2015-08-12 DIAGNOSIS — I509 Heart failure, unspecified: Secondary | ICD-10-CM | POA: Diagnosis present

## 2015-08-12 DIAGNOSIS — Z79899 Other long term (current) drug therapy: Secondary | ICD-10-CM | POA: Diagnosis not present

## 2015-08-12 DIAGNOSIS — F17200 Nicotine dependence, unspecified, uncomplicated: Secondary | ICD-10-CM | POA: Insufficient documentation

## 2015-08-12 DIAGNOSIS — I482 Chronic atrial fibrillation: Secondary | ICD-10-CM | POA: Diagnosis not present

## 2015-08-12 DIAGNOSIS — I4819 Other persistent atrial fibrillation: Secondary | ICD-10-CM

## 2015-08-12 DIAGNOSIS — F1010 Alcohol abuse, uncomplicated: Secondary | ICD-10-CM | POA: Insufficient documentation

## 2015-08-12 MED ORDER — SACUBITRIL-VALSARTAN 24-26 MG PO TABS: 1.0000 | ORAL_TABLET | Freq: Two times a day (BID) | ORAL | Status: DC

## 2015-08-12 NOTE — Progress Notes (Signed)
Patient ID: Juan Mathews, male   DOB: 07/28/1970, 45 y.o.   MRN: [id number] Cardiology: Dr Donnie Aho HF Cardiology: Dr. Shirlee Latch  45 yo with history of atrial fibrillation and nonischemic cardiomyopathy presents for cardiology followup.  In 5/16 while living in [place], patient developed atrial fibrillation with RVR.  He was drinking heavily at the time.  EF was low and cardiac cath showed no CAD.  Before he was cardioverted, he moved to [place].  It appears that he stopped all his meds after this.  He was admitted in 4/17 with acute on chronic systolic CHF in the setting of afib/RVR.  Echo showed EF 10-15% with moderately dilated LV.  He was diuresed and rate controlled.  He returns for followup today.  He remains in atrial fibrillation with rate in 90s currently.  He has been doing well overall. He is not drinking ETOH.  He walks several times a week and lifts light weights.  No exertional dyspnea. No lightheadedness or palpitations.  No chest pain.  No orthopnea/PND.   ECG: atrial fibrillation at 101 with QTc 453 msec, narrow QRS, anterolateral T wave inversions  Labs (4/17): K 3.2, creatinne 1.04, HIV negative, TSH normal PMH: 1. Chronic systolic CHF: Nonischemic cardiomyopathy.  LHC (5/16) in [place] showed on significant CAD.  He was in atrial fibrillation with RVR when cardiomyopathy was detected, possible tachy-mediated CMP. Also history of heavy ETOH.  - Echo (4/17) with EF 10-15%, moderately dilated LV, severe LAE, moderate MR, PASP 33 mmHg.  2. Chronic atrial fibrillation: Since at least 5/16.  Never cardioverted.  3. ETOH abuse: Prior.   SH: Divorced.  Moved from [place].  Works for [employer].  Smoker.  Prior ETOH abuse, has quit.    FH: No cardiac disease that he knows about.   ROS: All systems reviewed and negative except as per HPI.   Current Outpatient Prescriptions  Medication Sig Dispense Refill  . furosemide (LASIX) 40 MG tablet Take 1 tablet (40 mg total) by mouth daily.  30 tablet 12  . metoprolol succinate (TOPROL-XL) 25 MG 24 hr tablet Take 25 mg by mouth 2 (two) times daily.    Marland Kitchen spironolactone (ALDACTONE) 25 MG tablet Take 1 tablet (25 mg total) by mouth daily. 30 tablet 12  . XARELTO 20 MG TABS tablet Take 20 mg by mouth daily.    Marland Kitchen zolpidem (AMBIEN) 10 MG tablet Take 10 mg by mouth at bedtime as needed. sleep    . sacubitril-valsartan (ENTRESTO) 24-26 MG Take 1 tablet by mouth 2 (two) times daily. 60 tablet 3   No current facility-administered medications for this encounter.   BP 118/70 mmHg  Pulse 93  Wt 220 lb (99.791 kg)  SpO2 98% General: NAD Neck: No JVD, no thyromegaly or thyroid nodule.  Lungs: Clear to auscultation bilaterally with normal respiratory effort. CV: Lateral PMI.  Heart irregular S1/S2, no S3/S4, no murmur.  No peripheral edema.  No carotid bruit.  Normal pedal pulses.  Abdomen: Soft, nontender, no hepatosplenomegaly, no distention.  Skin: Intact without lesions or rashes.  Neurologic: Alert and oriented x 3.  Psych: Normal affect. Extremities: No clubbing or cyanosis.  HEENT: Normal.   Assessment/Plan: 1. Chronic systolic CHF: Nonischemic cardiomyopathy, normal coronaries in 5/16. Possible tachycardia-mediated cardiomyopathy. ETOH certainly may play a role as he drank heavily until a few weeks prior to 4/17 admission. Viral myocarditis less likely but possible. No family history of cardiomyopathy. He looks euvolemic. Dyspnea has resolved.  - Continue Toprol XL 25 mg  bid. Ultimately, we need to try to get him out of atrial fibrillation. .  - BP is reasonable today.  Will have him stop lisinopril and in 36 hours, start Entresto 24/26 bid. BMET today and repeat in 10 days.   - Continue spironolactone.  - Continue current Lasix.   - Need to arrange for cardiac MRI to look for infiltrative disease.  - I think that he needs to get out of atrial fibrillation if at all possible given concern for tachy-mediated CMP. See  below. - He may need advanced therapies down the road, will need close followup.  2. Atrial fibrillation: HR in 90s today.  Left atrium is severely dilated.  I suspect that he may have been in atrial fibrillation for all of the last year.  I do not think that he will hold NSR without an antiarrhythmic, and I think that he needs to get back into NSR given concern for tachycardia-mediated cardiomyopathy.  He has an appointment in the pharmacy clinic for Tikosyn initiation.  He wants to go into the hospital next Friday.  He will have been on Xarelto for about 4 weeks at that point.  QT interval is ok on today's ECG.  Will plan to admit him for 3 days observation on Tikosyn and will cardiovert him at the end of that time if he remains in atrial fibrillation. 3. H/o ETOH abuse: Encouraged complete abstinence.   Juan Mathews 08/13/2015

## 2015-08-12 NOTE — Patient Instructions (Addendum)
STOP Lisinopril.  START Entresto 24/26 tab twice daily on SATURDAY. 1. Use 1 month free trial offer 2.  Use $10 monthly copay card (good for 1 year)  Will make appointment ASAP With Tikosyn clinic at [facility]. Address: [address] ([address] Floor), [place], [place] [address]  Phone: [phone]  Follow up with Dr. Shirlee Latch 3 weeks.  Do the following things EVERYDAY: 1) Weigh yourself in the morning before breakfast. Write it down and keep it in a log. 2) Take your medicines as prescribed 3) Eat low salt foods-Limit salt (sodium) to 2000 mg per day.  4) Stay as active as you can everyday 5) Limit all fluids for the day to less than 2 liters

## 2015-08-13 ENCOUNTER — Ambulatory Visit: Payer: Managed Care, Other (non HMO) | Admitting: Pharmacist

## 2015-08-18 ENCOUNTER — Telehealth (HOSPITAL_COMMUNITY): Payer: Self-pay | Admitting: Pharmacist

## 2015-08-18 NOTE — Telephone Encounter (Signed)
Entresto PA approved by [employer] from 08/16/15 through 08/15/16.   Tyler Deis. Bonnye Fava, PharmD, BCPS, CPP Clinical Pharmacist Pager: [phone] Phone: [phone] 08/18/2015 10:39 AM

## 2015-08-20 ENCOUNTER — Ambulatory Visit (INDEPENDENT_AMBULATORY_CARE_PROVIDER_SITE_OTHER): Payer: Managed Care, Other (non HMO) | Admitting: Pharmacist

## 2015-08-20 DIAGNOSIS — I4819 Other persistent atrial fibrillation: Secondary | ICD-10-CM

## 2015-08-20 DIAGNOSIS — I481 Persistent atrial fibrillation: Secondary | ICD-10-CM

## 2015-08-20 LAB — BASIC METABOLIC PANEL
BUN: 11 mg/dL (ref 7–25)
CHLORIDE: 102 mmol/L (ref 98–110)
CO2: 30 mmol/L (ref 20–31)
Calcium: 10.1 mg/dL (ref 8.6–10.3)
Creat: 0.92 mg/dL (ref 0.60–1.35)
GLUCOSE: 104 mg/dL — AB (ref 65–99)
POTASSIUM: 5.3 mmol/L (ref 3.5–5.3)
SODIUM: 140 mmol/L (ref 135–146)

## 2015-08-20 LAB — MAGNESIUM: Magnesium: 1.6 mg/dL (ref 1.5–2.5)

## 2015-08-20 NOTE — Progress Notes (Signed)
    Patient ID: Juan Mathews, male   DOB: 01-24-1971, 45 y.o.   MRN: [id number] Cardiology: Dr Donnie Aho HF Cardiology: Dr. Shirlee Latch  HPI Mr. Juan Mathews is a 45 yo male patient of Dr. Shirlee Latch and Dr. Donnie Aho with history of atrial fibrillation and nonischemic cardiomyopathy.  In 5/16 while living in [place], patient developed atrial fibrillation with RVR.  He was treated with liniopril, carvedilol, Xarelto and amiodarone initially with plans to cardiovert.  He stopped amiodarone after 2 doses due to side effects and moved to [facility] before undergoing cardioversion.  When he moved to [place] he stopped all of his medications.  He was seen by Dr. Donnie Aho on April 10 and restarted medications including Xarelto. He was admitted 4/17 for acute on chronic systolic CHF in the setting of Afib/RVR.  Plans were made to start Tikosyn once patient had been anticoagulated for at least 4 weeks.    Pt presents today by himself.  He is a little nervous about starting Tikosyn and his overall health condition.  He has not had a conversation with his family about his diagnosis and wants more information about Tikosyn before committing to starting the medication.  Discussed pt's concerns and the risks/benefits of Tikosyn.  He is more comfortable starting the medication but would still like to have time to tell his family first.  He would like to delay admission until next week.  Agree with pt that this will be fine.  Upon further questioning, he has only been on Xarelto since 4/19 rather than 4/10 so this will give a full 4 weeks of anticoagulation before admission.  All other medications acceptable with Tikosyn.     Current Outpatient Prescriptions  Medication Sig Dispense Refill  . furosemide (LASIX) 40 MG tablet Take 1 tablet (40 mg total) by mouth daily. 30 tablet 12  . metoprolol succinate (TOPROL-XL) 25 MG 24 hr tablet Take 25 mg by mouth 2 (two) times daily.    . sacubitril-valsartan (ENTRESTO) 24-26 MG Take 1 tablet by mouth  2 (two) times daily. 60 tablet 3  . spironolactone (ALDACTONE) 25 MG tablet Take 1 tablet (25 mg total) by mouth daily. 30 tablet 12  . XARELTO 20 MG TABS tablet Take 20 mg by mouth daily.    Marland Kitchen zolpidem (AMBIEN) 10 MG tablet Take 10 mg by mouth at bedtime as needed. sleep     No current facility-administered medications for this visit.   No Known Allergies  Assessment/Plan: 1. Atrial Fibrillation- Pt will be admitted for Tikosyn next week.  Will check BMET and Mg today to ensure no changes need to be made before admission.  Pt is agreeable to this plan.   Audrie Lia 08/20/2015

## 2015-08-24 ENCOUNTER — Telehealth (HOSPITAL_COMMUNITY): Payer: Self-pay

## 2015-08-24 MED ORDER — MAGNESIUM OXIDE -MG SUPPLEMENT 200 MG PO TABS: 1.0000 | ORAL_TABLET | Freq: Two times a day (BID) | ORAL | Status: DC

## 2015-08-24 NOTE — Telephone Encounter (Signed)
Multiple attempts made to reach patient concerning recent lab values from CHF clinic OV with Dr. Shirlee Latch. Per Dr. Shirlee Latch advised via VM to start mag 200 mg twice daily. Rx sent to preferred pharmacy electronically. Return phone number left if needed for questions/concerns.  Ave Filter

## 2015-08-27 ENCOUNTER — Ambulatory Visit: Payer: Managed Care, Other (non HMO) | Admitting: Pharmacist

## 2015-09-02 ENCOUNTER — Encounter (HOSPITAL_COMMUNITY): Payer: Managed Care, Other (non HMO)

## 2016-06-17 ENCOUNTER — Encounter (HOSPITAL_COMMUNITY): Payer: Self-pay | Admitting: Emergency Medicine

## 2016-06-17 ENCOUNTER — Emergency Department (HOSPITAL_COMMUNITY)
Admission: EM | Admit: 2016-06-17 | Discharge: 2016-06-18 | Disposition: A | Discharge status: Home / Self Care | Payer: 59 | Attending: Emergency Medicine | Admitting: Emergency Medicine

## 2016-06-17 DIAGNOSIS — F17200 Nicotine dependence, unspecified, uncomplicated: Secondary | ICD-10-CM | POA: Insufficient documentation

## 2016-06-17 DIAGNOSIS — F10920 Alcohol use, unspecified with intoxication, uncomplicated: Secondary | ICD-10-CM

## 2016-06-17 DIAGNOSIS — F10120 Alcohol abuse with intoxication, uncomplicated: Secondary | ICD-10-CM | POA: Insufficient documentation

## 2016-06-17 DIAGNOSIS — F1014 Alcohol abuse with alcohol-induced mood disorder: Secondary | ICD-10-CM

## 2016-06-17 DIAGNOSIS — F1010 Alcohol abuse, uncomplicated: Secondary | ICD-10-CM | POA: Diagnosis present

## 2016-06-17 DIAGNOSIS — Z79899 Other long term (current) drug therapy: Secondary | ICD-10-CM | POA: Insufficient documentation

## 2016-06-17 DIAGNOSIS — I5022 Chronic systolic (congestive) heart failure: Secondary | ICD-10-CM | POA: Diagnosis not present

## 2016-06-17 LAB — RAPID URINE DRUG SCREEN, HOSP PERFORMED
Amphetamines: NOT DETECTED
Barbiturates: NOT DETECTED
Benzodiazepines: NOT DETECTED
Cocaine: NOT DETECTED
Opiates: NOT DETECTED
Tetrahydrocannabinol: POSITIVE — AB

## 2016-06-17 LAB — COMPREHENSIVE METABOLIC PANEL
ALT: 43 U/L (ref 17–63)
AST: 65 U/L — ABNORMAL HIGH (ref 15–41)
Albumin: 4.6 g/dL (ref 3.5–5.0)
Alkaline Phosphatase: 46 U/L (ref 38–126)
Anion gap: 13 (ref 5–15)
BUN: 11 mg/dL (ref 6–20)
CO2: 25 mmol/L (ref 22–32)
Calcium: 8.9 mg/dL (ref 8.9–10.3)
Chloride: 103 mmol/L (ref 101–111)
Creatinine, Ser: 1.02 mg/dL (ref 0.61–1.24)
GFR calc Af Amer: >60 mL/min (ref >60)
GFR calc non Af Amer: >60 mL/min (ref >60)
Glucose, Bld: 107 mg/dL — ABNORMAL HIGH (ref 65–99)
Potassium: 3.3 mmol/L — ABNORMAL LOW (ref 3.5–5.1)
Sodium: 141 mmol/L (ref 135–145)
Total Bilirubin: 0.7 mg/dL (ref 0.3–1.2)
Total Protein: 7.7 g/dL (ref 6.5–8.1)

## 2016-06-17 LAB — CBC WITH DIFFERENTIAL/PLATELET
Basophils Absolute: 0 10*3/uL (ref 0.0–0.1)
Basophils Relative: 1 %
Eosinophils Absolute: 0.1 10*3/uL (ref 0.0–0.7)
Eosinophils Relative: 1 %
HCT: 38.4 % — ABNORMAL LOW (ref 39.0–52.0)
Hemoglobin: 13.3 g/dL (ref 13.0–17.0)
Lymphocytes Relative: 47 %
Lymphs Abs: 3.8 10*3/uL (ref 0.7–4.0)
MCH: 31.7 pg (ref 26.0–34.0)
MCHC: 34.6 g/dL (ref 30.0–36.0)
MCV: 91.6 fL (ref 78.0–100.0)
Monocytes Absolute: 0.4 10*3/uL (ref 0.1–1.0)
Monocytes Relative: 5 %
Neutro Abs: 3.7 10*3/uL (ref 1.7–7.7)
Neutrophils Relative %: 46 %
Platelets: 186 10*3/uL (ref 150–400)
RBC: 4.19 MIL/uL — ABNORMAL LOW (ref 4.22–5.81)
RDW: 12.3 % (ref 11.5–15.5)
WBC: 8 10*3/uL (ref 4.0–10.5)

## 2016-06-17 LAB — ETHANOL: Alcohol, Ethyl (B): 454 mg/dL (ref <5)

## 2016-06-17 MED ORDER — ONDANSETRON HCL 4 MG PO TABS: 4.0000 mg | ORAL_TABLET | Freq: Three times a day (TID) | ORAL | Status: DC | PRN

## 2016-06-17 MED ORDER — ALUM & MAG HYDROXIDE-SIMETH 200-200-20 MG/5ML PO SUSP: 30.0000 mL | ORAL | Status: DC | PRN

## 2016-06-17 MED ORDER — LORAZEPAM 1 MG PO TABS
1.0000 mg | ORAL_TABLET | Freq: Three times a day (TID) | ORAL | Status: DC | PRN
  Administered 2016-06-17: 1 mg via ORAL
  Filled 2016-06-17: qty 1

## 2016-06-17 NOTE — ED Notes (Signed)
Bed: [facility] Expected date:  Expected time:  Means of arrival:  Comments: 

## 2016-06-17 NOTE — ED Notes (Signed)
Primary RN and Dr Juleen China made aware of critical ETOH 454

## 2016-06-17 NOTE — ED Notes (Signed)
Bed: ZC58 Expected date:  Expected time:  Means of arrival:  Comments: EVS CLEANING

## 2016-06-17 NOTE — ED Triage Notes (Addendum)
Pt brought by [employer], IVCed by his brother for being danger to self and others, attempted to jump off a bridge multiple times several weeks ago, alcohol intoxication, currently leaving texts and voicemails threatening his brother with bodily harm. Mother recently died, exacerbated chronic alcohol problem/addiction. Pt intermittently combative, hostile, but has not yet been physically aggressive, per [employer].

## 2016-06-17 NOTE — ED Provider Notes (Signed)
[facility] DEPT Provider Note   CSN: [id number] Arrival date & time: 06/17/16  1646     History   Chief Complaint Chief Complaint  Patient presents with  . IVC    HPI Juan Mathews is a 46 y.o. male.  HPI   46 year old male brought in under involuntary commitment. Patient was petitioned by his brother. Paperwork stating that the patient was a danger to himself and others. Allegedly he attempted to jump off a bridge several weeks ago. Allegedly sending threatening text to his brother as well. He reportedly has a drug and alcohol addiction and his usage increased after the death of his mother on 2023/04/28. Patient does admit to drinking alcohol today. He denies any thoughts to harm himself or anybody else. Patient states that his brother filled out the paperwork vindictively because they are currently fighting over how to divide their mother's assets.   Past Medical History:  Diagnosis Date  . Atrial fibrillation (HCC)   . CHF (congestive heart failure) (HCC)   . Chronic systolic CHF (congestive heart failure) (HCC) 07/28/2015  . Obesity (BMI 30-39.9)   . Persistent atrial fibrillation (HCC) 07/28/2015   Diagnosed 2016 in [place] went off treatment and moved here  CHA2DS2VASC score 1      Patient Active Problem List   Diagnosis Date Noted  . Acute on chronic systolic congestive heart failure (HCC) 07/28/2015  . Chronic systolic CHF (congestive heart failure) (HCC) 07/28/2015  . Persistent atrial fibrillation (HCC) 07/28/2015  . Obesity (BMI 30-39.9) 07/28/2015  . Long term (current) use of anticoagulants 07/28/2015    History reviewed. No pertinent surgical history.     Home Medications    Prior to Admission medications   Medication Sig Start Date End Date Taking? Authorizing Provider  furosemide (LASIX) 40 MG tablet Take 1 tablet (40 mg total) by mouth daily. 07/30/15   Othella Boyer, MD  Magnesium Oxide 200 MG TABS Take 1 tablet (200 mg total) by mouth 2  (two) times daily. 08/24/15   Laurey Morale, MD  metoprolol succinate (TOPROL-XL) 25 MG 24 hr tablet Take 25 mg by mouth 2 (two) times daily.    Historical Provider, MD  sacubitril-valsartan (ENTRESTO) 24-26 MG Take 1 tablet by mouth 2 (two) times daily. 08/12/15   Laurey Morale, MD  spironolactone (ALDACTONE) 25 MG tablet Take 1 tablet (25 mg total) by mouth daily. 07/30/15   Othella Boyer, MD  XARELTO 20 MG TABS tablet Take 20 mg by mouth daily. 07/19/15   Historical Provider, MD  zolpidem (AMBIEN) 10 MG tablet Take 10 mg by mouth at bedtime as needed. sleep 07/07/15   Historical Provider, MD    Family History History reviewed. No pertinent family history.  Social History Social History  Substance Use Topics  . Smoking status: Current Every Day Smoker  . Smokeless tobacco: Not on file  . Alcohol use 0.0 oz/week     Comment: Heavy in [ast      Allergies   Patient has no known allergies.   Review of Systems Review of Systems  All systems reviewed and negative, other than as noted in HPI.   Physical Exam Updated Vital Signs There were no vitals taken for this visit.  Physical Exam  Constitutional: He appears well-developed and well-nourished. No distress.  HENT:  Head: Normocephalic and atraumatic.  Disheveled appearance. Subacute appearing abrasions to bilateral feet and a small one on his forehead.   Eyes: Conjunctivae are normal. Right eye exhibits  no discharge. Left eye exhibits no discharge.  Neck: Neck supple.  Cardiovascular: Normal rate, regular rhythm and normal heart sounds.  Exam reveals no gallop and no friction rub.   No murmur heard. Pulmonary/Chest: Effort normal and breath sounds normal. No respiratory distress.  Abdominal: Soft. He exhibits no distension. There is no tenderness.  Musculoskeletal: He exhibits no edema or tenderness.  Neurological: He is alert.  Skin: Skin is warm and dry.  Psychiatric: He has a normal mood and affect. His behavior is  normal. Thought content normal.  Nursing note and vitals reviewed.    ED Treatments / Results  Labs (all labs ordered are listed, but only abnormal results are displayed) Labs Reviewed - No data to display  EKG  EKG Interpretation None       Radiology No results found.  Procedures Procedures (including critical care time)  Medications Ordered in ED Medications - No data to display   Initial Impression / Assessment and Plan / ED Course  I have reviewed the triage vital signs and the nursing notes.  Pertinent labs & imaging results that were available during my care of the patient were reviewed by me and considered in my medical decision making (see chart for details).     46 year old male brought in for psychiatric evaluation. He denies the allegations on his IVC paperwork. Currently, I fee he is too intoxicated to obtain a reliable assessment. He'll be observed until he is clinically sober and be reassessed. He has some scattered abrasions which appear subacute to me. He denies any trauma or acute pain. His neuro exam is consistent with ETOH intoxication w/o focal deficits. Will obtain screening labs.   Final Clinical Impressions(s) / ED Diagnoses   Final diagnoses:  Alcoholic intoxication without complication [facility])    New Prescriptions New Prescriptions   No medications on file     Raeford Razor, MD 06/18/16 [phone]

## 2016-06-17 NOTE — ED Notes (Signed)
Report to Spencer, RN, to assume care of patient at this time.  

## 2016-06-18 DIAGNOSIS — F1014 Alcohol abuse with alcohol-induced mood disorder: Secondary | ICD-10-CM | POA: Diagnosis present

## 2016-06-18 MED ORDER — ADULT MULTIVITAMIN W/MINERALS CH
1.0000 | ORAL_TABLET | Freq: Every day | ORAL | Status: DC
  Administered 2016-06-18: 1 via ORAL
  Filled 2016-06-18: qty 1

## 2016-06-18 MED ORDER — GABAPENTIN 300 MG PO CAPS: 300.0000 mg | ORAL_CAPSULE | Freq: Three times a day (TID) | ORAL | 0 refills | Status: DC

## 2016-06-18 MED ORDER — VITAMIN B-1 100 MG PO TABS: 100.0000 mg | ORAL_TABLET | Freq: Every day | ORAL | Status: DC

## 2016-06-18 MED ORDER — HYDROXYZINE HCL 25 MG PO TABS: 25.0000 mg | ORAL_TABLET | Freq: Four times a day (QID) | ORAL | Status: DC | PRN

## 2016-06-18 MED ORDER — VITAMIN B-1 100 MG PO TABS
100.0000 mg | ORAL_TABLET | Freq: Once | ORAL | Status: AC
  Administered 2016-06-18: 100 mg via ORAL
  Filled 2016-06-18: qty 1

## 2016-06-18 MED ORDER — CHLORDIAZEPOXIDE HCL 25 MG PO CAPS
25.0000 mg | ORAL_CAPSULE | Freq: Four times a day (QID) | ORAL | Status: DC
  Administered 2016-06-18: 25 mg via ORAL
  Filled 2016-06-18: qty 1

## 2016-06-18 MED ORDER — CHLORDIAZEPOXIDE HCL 25 MG PO CAPS: 25.0000 mg | ORAL_CAPSULE | ORAL | Status: DC

## 2016-06-18 MED ORDER — THIAMINE HCL 100 MG/ML IJ SOLN: 100.0000 mg | Freq: Once | INTRAMUSCULAR | Status: DC

## 2016-06-18 MED ORDER — GABAPENTIN 300 MG PO CAPS
300.0000 mg | ORAL_CAPSULE | Freq: Three times a day (TID) | ORAL | Status: DC
  Administered 2016-06-18: 300 mg via ORAL
  Filled 2016-06-18: qty 1

## 2016-06-18 MED ORDER — ONDANSETRON 4 MG PO TBDP: 4.0000 mg | ORAL_TABLET | Freq: Four times a day (QID) | ORAL | Status: DC | PRN

## 2016-06-18 MED ORDER — LOPERAMIDE HCL 2 MG PO CAPS: 2.0000 mg | ORAL_CAPSULE | ORAL | Status: DC | PRN

## 2016-06-18 MED ORDER — CHLORDIAZEPOXIDE HCL 25 MG PO CAPS: 25.0000 mg | ORAL_CAPSULE | Freq: Every day | ORAL | Status: DC

## 2016-06-18 MED ORDER — CHLORDIAZEPOXIDE HCL 25 MG PO CAPS: 25.0000 mg | ORAL_CAPSULE | Freq: Three times a day (TID) | ORAL | Status: DC

## 2016-06-18 MED ORDER — CHLORDIAZEPOXIDE HCL 25 MG PO CAPS: 25.0000 mg | ORAL_CAPSULE | Freq: Four times a day (QID) | ORAL | Status: DC | PRN

## 2016-06-18 NOTE — ED Notes (Addendum)
Pt requesting to contact his girlfriend to let her know that he is here. Pt informed that no phone calls until 0900 this morning. Pt verbalize understanding.

## 2016-06-18 NOTE — Progress Notes (Signed)
CSW filed patient's examination and recommendation paperwork into IVC logbook.  Mallory Schaad, LCSWA [facility]  Clinical Social Worker [phone] 

## 2016-06-18 NOTE — [facility] Suicide Risk Assessment (Signed)
Suicide Risk Assessment  Discharge Assessment   [facility] Discharge Suicide Risk Assessment   Principal Problem: Alcohol abuse with alcohol-induced mood disorder [facility]) Discharge Diagnoses:  Patient Active Problem List   Diagnosis Date Noted  . Alcohol abuse with alcohol-induced mood disorder (HCC) [F10.14] 06/18/2016    Priority: High  . Acute on chronic systolic congestive heart failure (HCC) [I50.23] 07/28/2015  . Chronic systolic CHF (congestive heart failure) (HCC) [I50.22] 07/28/2015  . Persistent atrial fibrillation (HCC) [I48.1] 07/28/2015  . Obesity (BMI 30-39.9) [E66.9] 07/28/2015  . Long term (current) use of anticoagulants [Z79.01] 07/28/2015    Total Time spent with patient: 45 minutes  Musculoskeletal: Strength & Muscle Tone: within normal limits Gait & Station: normal Patient leans: N/A  Psychiatric Specialty Exam:   Blood pressure 115/67, pulse 110, temperature 98.9 F (37.2 C), temperature source Oral, resp. rate 18, SpO2 95 %.There is no height or weight on file to calculate BMI.  General Appearance: Casual  Eye Contact::  Good  Speech:  Normal Rate409  Volume:  Normal  Mood:  Depressed, mild  Affect:  Congruent  Thought Process:  Coherent and Descriptions of Associations: Intact  Orientation:  Full (Time, Place, and Person)  Thought Content:  WDL and Logical  Suicidal Thoughts:  No  Homicidal Thoughts:  No  Memory:  Immediate;   Good Recent;   Good Remote;   Good  Judgement:  Fair  Insight:  Fair  Psychomotor Activity:  Normal  Concentration:  Good  Recall:  Good  Fund of Knowledge:Good  Language: Good  Akathisia:  No  Handed:  Right  AIMS (if indicated):     Assets:  Housing Leisure Time Physical Health Resilience Vocational/Educational  Sleep:     Cognition: WNL  ADL's:  Intact   Mental Status Per Nursing Assessment::   On Admission:   Family issues regarding inheritance, brother IVC'd him due to this.  Patient is calm and cooperative  today.  No suicidal/homicidal ideations,hallucintions, mild withdrawal symptoms-gabapentin given with instructions.  Encouraged and given outpatient resources.  Demographic Factors:  Male, Caucasian and Living alone  Loss Factors: NA  Historical Factors: NA  Risk Reduction Factors:   Sense of responsibility to family, Employed and Positive social support  Continued Clinical Symptoms:  Depression, mild  Cognitive Features That Contribute To Risk:  None    Suicide Risk:  Minimal: No identifiable suicidal ideation.  Patients presenting with no risk factors but with morbid ruminations; may be classified as minimal risk based on the severity of the depressive symptoms  Follow-up Information    [facility]. Call on 06/19/2016.   Contact information: [address] [place] [address] [phone]           Plan Of Care/Follow-up recommendations:  Activity:  as tolerated Diet:  heart healthy diet  LORD, JAMISON, NP 06/18/2016, 11:34 AM

## 2016-06-18 NOTE — BH Assessment (Addendum)
Tele Assessment Note   Juan Mathews is an 46 y.o. separated male who presents unaccompanied to [facility] ED via Patent examiner after Pt's brother, Juan Mathews [phone], petitioned for involuntary commitment. Affidavit and petition states: "A danger to self and others, to wit,Responetn suffers from alcohol addiction; attempted to harm himself and jump off a bridge several weeks ago; currently leaving texts and voicemails threatening petitioner with bodily harm; petitioner states he has had issues with alcohol for some time, but the recent death of his mother has exacerbated the situation."  Pt says his brother petitioned for involuntary commitment vindictively because they are arguing over their mother's estate. Pt says he brother believes Pt received an unfair portion of his mother's assets. Pt says he never threatened to jump from a bridge. Pt denies current suicidal ideation or any history of suicide attempts. He denies any history of intentional self-injurious behavior. Pt says he and his brother have both sent angry text to one another. Pt denies any recent homicidal ideation or history of violence. Pt denies depressive symptoms and says his mood has been good. He denies any history of psychotic symptoms.  Pt reports he drinks approximately one bottle of wine 3-4 times per week. Pt says he was drinking vodka yesterday and drank approximately ten shots. Pt's blood alcohol level on arrival was 454. Pt reports infrequent marijuana abuse and denies any other substance use. Pt's urine drug screen is positive for cannabis. Pt denies any history of withdrawal symptoms.  Pt reports he is separated and his wife and their nine-year-old daughter are living in [place]. Pt says his life is good. 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 He denies any current legal problems. He denies any history of abuse or trauma. He reports he has had some brief counseling in the past. He denies any  history of inpatient mental health or substance abuse treatment.   Pt is dressed in hospital scrubs, alert, oriented x4 with normal speech and normal motor behavior. Eye contact is good. Pt's mood is euthymic and affect is congruent with mood. Thought process is coherent and relevant. There is no indication Pt is currently responding to internal stimuli or experiencing delusional thought content. Pt states he wants to be discharged.   Diagnosis: Alcohol Use Disorder  Past Medical History:  Past Medical History:  Diagnosis Date  . Atrial fibrillation (HCC)   . CHF (congestive heart failure) (HCC)   . Chronic systolic CHF (congestive heart failure) (HCC) 07/28/2015  . Obesity (BMI 30-39.9)   . Persistent atrial fibrillation (HCC) 07/28/2015   Diagnosed 2016 in [place] went off treatment and moved here  CHA2DS2VASC score 1      History reviewed. No pertinent surgical history.  Family History: History reviewed. No pertinent family history.  Social History:  reports that he has been smoking.  He does not have any smokeless tobacco history on file. He reports that he drinks alcohol. He reports that he uses drugs, including Marijuana.  Additional Social History:  Alcohol / Drug Use Pain Medications: See MAR Prescriptions: See MAR Over the Counter: See MAR History of alcohol / drug use?: Yes Longest period of sobriety (when/how long): Unknown Negative Consequences of Use:  (Pt denies) Withdrawal Symptoms:  (Pt denies) Substance #1 Name of Substance 1: Alcohol 1 - Age of First Use: 22 1 - Amount (size/oz): One bottle of wine 1 - Frequency: 3-4 times per week 1 - Duration: Ongoing 1 - Last Use /  Amount: 06/17/16, 10 shots of vodka  CIWA: CIWA-Ar BP: 121/80 Pulse Rate: 69 COWS:    PATIENT STRENGTHS: (choose at least two) Ability for insight Average or above average intelligence Capable of independent living Communication skills Financial means General fund of  knowledge Physical Health Supportive family/friends Work skills  Allergies: No Known Allergies  Home Medications:  (Not in a hospital admission)  OB/GYN Status:  No LMP for male patient.  General Assessment Data Location of Assessment: [facility] ED TTS Assessment: In system Is this a Tele or Face-to-Face Assessment?: Tele Assessment Is this an Initial Assessment or a Re-assessment for this encounter?: Initial Assessment Marital status: Separated Maiden name: NA Is patient pregnant?: No Pregnancy Status: No Living Arrangements: Alone Can pt return to current living arrangement?: Yes Admission Status: Involuntary Is patient capable of signing voluntary admission?: Yes Referral Source: Self/Family/Friend Insurance type: Community education officer     Crisis Care Plan Living Arrangements: Alone Legal Guardian: Other: (Self) Name of Psychiatrist: None Name of Therapist: None  Education Status Is patient currently in school?: No Current Grade: NA Highest grade of school patient has completed: Bachelor's degree Name of school: NA Contact person: NA  Risk to self with the past 6 months Suicidal Ideation: No Has patient been a risk to self within the past 6 months prior to admission? : No Suicidal Intent: No Has patient had any suicidal intent within the past 6 months prior to admission? : No Is patient at risk for suicide?: No Suicidal Plan?: No Has patient had any suicidal plan within the past 6 months prior to admission? : No Access to Means: No What has been your use of drugs/alcohol within the last 12 months?: Pt drinks alcohol regularly and occasionally uses marijuana Previous Attempts/Gestures: No How many times?: 0 Other Self Harm Risks: None identified Triggers for Past Attempts: None known Intentional Self Injurious Behavior: None Family Suicide History: No Recent stressful life event(s): Conflict (Comment), Loss (Comment) (Mother died, estate issues) Persecutory voices/beliefs?:  No Depression: No Substance abuse history and/or treatment for substance abuse?: Yes Suicide prevention information given to non-admitted patients: Not applicable  Risk to Others within the past 6 months Homicidal Ideation: No Does patient have any lifetime risk of violence toward others beyond the six months prior to admission? : No Thoughts of Harm to Others: No Current Homicidal Intent: No Current Homicidal Plan: No Access to Homicidal Means: No Identified Victim: None History of harm to others?: No Assessment of Violence: None Noted Violent Behavior Description: Pt denies history of violence Does patient have access to weapons?: No Criminal Charges Pending?: No Does patient have a court date: No Is patient on probation?: No  Psychosis Hallucinations: None noted Delusions: None noted  Mental Status Report Appearance/Hygiene: In scrubs Eye Contact: Good Motor Activity: Unremarkable Speech: Logical/coherent Level of Consciousness: Alert Mood: Euthymic Affect: Appropriate to circumstance Anxiety Level: None Thought Processes: Coherent, Relevant Judgement: Unimpaired Orientation: Person, Place, Time, Situation, Appropriate for developmental age Obsessive Compulsive Thoughts/Behaviors: None  Cognitive Functioning Concentration: Normal Memory: Recent Intact, Remote Intact IQ: Average Insight: Good Impulse Control: Good Appetite: Good Weight Loss: 0 Weight Gain: 0 Sleep: No Change Total Hours of Sleep: 8 Vegetative Symptoms: None  ADLScreening [facility] Assessment Services) Patient's cognitive ability adequate to safely complete daily activities?: Yes Patient able to express need for assistance with ADLs?: Yes Independently performs ADLs?: Yes (appropriate for developmental age)  Prior Inpatient Therapy Prior Inpatient Therapy: No Prior Therapy Dates: NA Prior Therapy Facilty/Provider(s): NA Reason for Treatment:  NA  Prior Outpatient Therapy Prior Outpatient  Therapy: No Prior Therapy Dates: NA Prior Therapy Facilty/Provider(s): NA Reason for Treatment: NA Does patient have an ACCT team?: No Does patient have Intensive In-House Services?  : No Does patient have [facility] services? : No Does patient have P4CC services?: No  ADL Screening (condition at time of admission) Patient's cognitive ability adequate to safely complete daily activities?: Yes Is the patient deaf or have difficulty hearing?: No Does the patient have difficulty seeing, even when wearing glasses/contacts?: No Does the patient have difficulty concentrating, remembering, or making decisions?: No Patient able to express need for assistance with ADLs?: Yes Does the patient have difficulty dressing or bathing?: No Independently performs ADLs?: Yes (appropriate for developmental age) Does the patient have difficulty walking or climbing stairs?: No Weakness of Legs: None Weakness of Arms/Hands: None       Abuse/Neglect Assessment (Assessment to be complete while patient is alone) Physical Abuse: Denies Verbal Abuse: Denies Sexual Abuse: Denies Exploitation of patient/patient's resources: Denies Self-Neglect: Denies     Merchant navy officer (For Healthcare) Does Patient Have a Medical Advance Directive?: No Would patient like information on creating a medical advance directive?: No - Patient declined    Additional Information 1:1 In Past 12 Months?: No CIRT Risk: No Elopement Risk: No Does patient have medical clearance?: Yes     Disposition: Gave clinical report to Nira Conn, NP who recommended Pt be evaluated later this morning by psychiatry. Notified Dr. Blinda Leatherwood and Eustace Pen, RN of recommendation.  Disposition Initial Assessment Completed for this Encounter: Yes Disposition of Patient: Other dispositions Other disposition(s): Other (Comment) (Psychiatric evaluation later this morning)   Pamalee Leyden, [facility], [facility], [facility] Triage Specialist [phone]   Patsy Baltimore, Harlin Rain 06/18/2016 4:36 AM

## 2017-01-18 ENCOUNTER — Encounter: Payer: Self-pay | Admitting: Cardiology

## 2017-01-18 NOTE — Consult Note (Signed)
Juan Mathews    Date of visit:  01/18/2017 DOB:  08/23/1970    Age:  46 yrs. Medical record number:  [id number]     Account number:  [id number] Primary Care Provider: Loney Loh ____________________________ CURRENT DIAGNOSES  1. Cardiomyopathy  2. Persistent atrial fibrillation  3. Chronic systolic heart failure  4. Dyspnea  5. Long term (current) use of anticoagulants  6. Obesity ____________________________ ALLERGIES  No Known Drug Allergies ____________________________ MEDICATIONS  1. Belsomra 10 mg tablet, PRN  2. furosemide 40 mg tablet, 1 p.o. daily  3. lisinopril 2.5 mg tablet, 1 p.o. daily  4. metoprolol succinate ER 50 mg tablet,extended release 24 hr, one tablet twice daily  5. spironolactone 25 mg tablet, 1 p.o. daily  6. Xarelto 20 mg tablet, 1 p.o. daily ____________________________ HISTORY OF PRESENT ILLNESS Patient returns after a long absence. The patient has a history of a cardiomyopathy that we thought was tachycardia induced. He has previous rapid atrial fibrillation and there was a question of alcohol use. He was cardioverted but reverted back to atrial fibrillation. I saw him and I hospitalized him with rapid atrial fibrillation and heart failure. He was diuresed and had a markedly low EF 10-15% EF. Dr. Elizebeth Brooking saw him from advanced heart failure and recommended admission for a TEE cardioversion and initiation of Tikosyn but the patient was not willing to followup with Dr. Shirlee Latch or to begin Tikosyn.  I last saw him in August of 2017. At that point in time he did not want to initiate Tikosyn and we also talked about his cardiomyopathy. He failed to return for followup since then . He evidently ran out of his medicines and was [facility] and I received a phone call from him when he was in [facility] demanding that his medicines be refilled. He was completely out of all of his medications and had failed to return for followup here so I gave him 2 weeks worth with  instructions to return when he got back in town. He failed to return a week or so ago but comes in today. He doesn't feel well and complains of shortness of breath. He says that he is no longer smoking and states that he is really not using alcohol to excess at the present time. He has gained 25 pounds since he was here. He is not taking medicines consistently and states that he is short of breath and wants to have something done now about the atrial fibrillation. He does not have PND or orthopnea. He has no claudication or angina. He previously had catheterization in [place] but did not show significant coronary artery disease. His mother died last summer and he has had difficulty with severe insomnia since then. He has only been taking his anticoagulation intermittently as well as his other medicines intermittently. ____________________________ PAST HISTORY  Past Medical Illnesses:  obesity, denies hypertension or diabetes;  Cardiovascular Illnesses:  atrial fibrillation, cardiomyopathy(dilated);  Infectious Diseases:  no previous history of significant infectious diseases;  Surgical Procedures:  no previous surgical procedures;  Trauma History:  no previous history of significant trauma;  NYHA Classification:  II;  Canadian Angina Classification:  Class 0: Asymptomatic;  Cardiology Procedures-Invasive:  cardiac cath (left) May 2016;  Cardiology Procedures-Noninvasive:  echocardiogram April 2017;  Cardiac Cath Results:  no significant disease;  Peripheral Vascular Procedures:  no previous invasive peripheral vascular procedures.;  LVEF of 15% documented via echocardiogram on 07/29/2015,   ____________________________ CARDIO-PULMONARY TEST DATES EKG Date:  01/18/2017;   Cardiac Cath Date:  09/01/2014;  Echocardiography Date: 07/29/2015;   ____________________________ FAMILY HISTORY Brother -- Brother alive and well Father -- Father alive and well Mother -- Mother dead, Sudden death  Adult ____________________________ SOCIAL HISTORY Alcohol Use:  occasionally;  Smoking:  used to smoke but quit 2018, 20 pack year history;  Diet:  regular diet;  Lifestyle:  divorced;  Exercise:  no regular exercise;  Occupation:  unemployed;  Residence:  lives alone;   ____________________________ REVIEW OF SYSTEMS General:  weight gain of approximately 25 lbs  Integumentary:no rashes or new skin lesions. Eyes: wears eye glasses/contact lenses, denies diplopia, glaucoma or visual field defects. Ears, Nose, Throat, Mouth:  denies any hearing loss, epistaxis, hoarseness or difficulty speaking. Respiratory: dyspnea with exertion Cardiovascular:  please review HPI Abdominal: denies dyspepsia, GI bleeding, constipation, or diarrhea Genitourinary-Male: hesitancy  Musculoskeletal:  foot pain Neurological:  denies headaches, stroke, or TIA Psychiatric:  situational stress, insomnia  ____________________________ PHYSICAL EXAMINATION VITAL SIGNS  Blood Pressure:  104/72 Sitting, Left arm, large cuff  , 110/82 Standing, Left arm and large cuff   Pulse:  130/min. Weight:  260.00 lbs. Height:  74.00"BMI: 33  Constitutional:  pleasant white male in no acute distress, mildly obese Skin:  warm and dry to touch, no apparent skin lesions, or masses noted. Head:  normocephalic, normal hair pattern, no masses or tenderness Eyes:  EOMS Intact, PERRLA, C and S clear, Funduscopic exam not done. ENT:  ears, nose and throat reveal no gross abnormalities.  Dentition good. Neck:  supple, without massess. No JVD, thyromegaly or carotid bruits. Carotid upstroke normal. Chest:  normal symmetry, clear to auscultation. Cardiac:  rapid irregularly irregular rhythm, normal S1 and S2, no S3 or S4, no murmurs,clicks, or rub heard Abdomen:  abdomen soft,non-tender, no masses, no hepatospenomegaly, or aneurysm noted Peripheral Pulses:  the femoral,dorsalis pedis, and posterior tibial pulses are full and equal bilaterally with no  bruits auscultated. Extremities & Back:  no deformities, clubbing, cyanosis, erythema or edema observed. Normal muscle strength and tone. Neurological:  no gross motor or sensory deficits noted, affect appropriate, oriented x3. ____________________________ MOST RECENT LIPID PANEL 12/29/15  CHOL TOTL 203 mg/dl, LDL 409 NM, HDL 68 mg/dl and TRIGLYCER 73 mg/dl ____________________________ IMPRESSIONS/PLAN  1. 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 Rapid atrial fibrillation currently uncontrolled 3. Obesity with weight gain 4. Long-term use of anticoagulation 5. Medical noncompliance  Recommendations:  Long discussion with patient about compliance issues and contribution of rapid persistent atrial fibrillation and cardiomyopathy. I would like him to have a repeat echocardiogram. Additional lab work was evaluated today. Prescriptions for just 2 months of his medicines were written. He should increase his metoprolol to 50 mg twice daily to help with rate control. This is the fastest that his rate has been. Twelve-lead EKG personally reviewed by me shows rapid atrial fibrillation with a rate of 143.  I have recommended that he have consultation with atrial fibrillation specialists. In light of his cardiomyopathy I think there is a pretty good chance that he will need to have an ablation or other advanced measures such as amiodarone or Tikosyn and he states that he is willing to pursue that at the present time. We'll make referral to atrial fibrillation specialists and I will see him in followup in 2 months.  Greater than 40 minutes spent with greater than 50% of the time spent face-to-face including discussion about compliance, and education about atrial fibrillation and cardiomyopathy and coordination of care. ____________________________  TODAYS ORDERS  1. 2D, color flow, doppler: First Available  2. Comprehensive Metabolic Panel: Today  3. TSH: Today  4. Complete Blood Count: Today   5. 12 Lead EKG: Today  6. Return Visit: 2 months                       ____________________________ Cardiology Physician:  Darden Palmer MD [facility]

## 2017-02-07 ENCOUNTER — Institutional Professional Consult (permissible substitution): Payer: Managed Care, Other (non HMO) | Admitting: Internal Medicine

## 2017-02-28 ENCOUNTER — Institutional Professional Consult (permissible substitution): Payer: Managed Care, Other (non HMO) | Admitting: Internal Medicine

## 2017-05-03 ENCOUNTER — Other Ambulatory Visit: Payer: Self-pay

## 2017-05-03 ENCOUNTER — Inpatient Hospital Stay (HOSPITAL_COMMUNITY)
Admission: EM | Admit: 2017-05-03 | Discharge: 2017-05-05 | DRG: 641 | Discharge status: Against Medical Advice | Payer: Self-pay | Attending: Internal Medicine | Admitting: Internal Medicine

## 2017-05-03 ENCOUNTER — Emergency Department (HOSPITAL_COMMUNITY): Payer: Self-pay

## 2017-05-03 ENCOUNTER — Encounter (HOSPITAL_COMMUNITY): Payer: Self-pay | Admitting: Emergency Medicine

## 2017-05-03 DIAGNOSIS — E860 Dehydration: Secondary | ICD-10-CM | POA: Diagnosis present

## 2017-05-03 DIAGNOSIS — F1010 Alcohol abuse, uncomplicated: Secondary | ICD-10-CM

## 2017-05-03 DIAGNOSIS — F10239 Alcohol dependence with withdrawal, unspecified: Secondary | ICD-10-CM | POA: Diagnosis present

## 2017-05-03 DIAGNOSIS — F10930 Alcohol use, unspecified with withdrawal, uncomplicated: Secondary | ICD-10-CM

## 2017-05-03 DIAGNOSIS — F10939 Alcohol use, unspecified with withdrawal, unspecified: Secondary | ICD-10-CM | POA: Diagnosis present

## 2017-05-03 DIAGNOSIS — K2920 Alcoholic gastritis without bleeding: Secondary | ICD-10-CM | POA: Diagnosis present

## 2017-05-03 DIAGNOSIS — F17210 Nicotine dependence, cigarettes, uncomplicated: Secondary | ICD-10-CM | POA: Diagnosis present

## 2017-05-03 DIAGNOSIS — F1014 Alcohol abuse with alcohol-induced mood disorder: Secondary | ICD-10-CM | POA: Diagnosis present

## 2017-05-03 DIAGNOSIS — R202 Paresthesia of skin: Secondary | ICD-10-CM | POA: Diagnosis present

## 2017-05-03 DIAGNOSIS — R739 Hyperglycemia, unspecified: Secondary | ICD-10-CM | POA: Diagnosis present

## 2017-05-03 DIAGNOSIS — I4819 Other persistent atrial fibrillation: Secondary | ICD-10-CM | POA: Diagnosis present

## 2017-05-03 DIAGNOSIS — F10230 Alcohol dependence with withdrawal, uncomplicated: Secondary | ICD-10-CM

## 2017-05-03 DIAGNOSIS — I110 Hypertensive heart disease with heart failure: Secondary | ICD-10-CM | POA: Diagnosis present

## 2017-05-03 DIAGNOSIS — D6959 Other secondary thrombocytopenia: Secondary | ICD-10-CM | POA: Diagnosis present

## 2017-05-03 DIAGNOSIS — I4891 Unspecified atrial fibrillation: Secondary | ICD-10-CM

## 2017-05-03 DIAGNOSIS — D72829 Elevated white blood cell count, unspecified: Secondary | ICD-10-CM

## 2017-05-03 DIAGNOSIS — F10931 Alcohol use, unspecified with withdrawal delirium: Secondary | ICD-10-CM | POA: Diagnosis present

## 2017-05-03 DIAGNOSIS — E46 Unspecified protein-calorie malnutrition: Secondary | ICD-10-CM | POA: Diagnosis present

## 2017-05-03 DIAGNOSIS — E872 Acidosis, unspecified: Secondary | ICD-10-CM | POA: Diagnosis present

## 2017-05-03 DIAGNOSIS — I426 Alcoholic cardiomyopathy: Secondary | ICD-10-CM | POA: Diagnosis present

## 2017-05-03 DIAGNOSIS — N179 Acute kidney failure, unspecified: Secondary | ICD-10-CM

## 2017-05-03 DIAGNOSIS — R0682 Tachypnea, not elsewhere classified: Secondary | ICD-10-CM | POA: Diagnosis present

## 2017-05-03 DIAGNOSIS — E876 Hypokalemia: Secondary | ICD-10-CM

## 2017-05-03 DIAGNOSIS — Z560 Unemployment, unspecified: Secondary | ICD-10-CM

## 2017-05-03 DIAGNOSIS — I5022 Chronic systolic (congestive) heart failure: Secondary | ICD-10-CM | POA: Diagnosis present

## 2017-05-03 DIAGNOSIS — F10231 Alcohol dependence with withdrawal delirium: Secondary | ICD-10-CM | POA: Diagnosis present

## 2017-05-03 DIAGNOSIS — F1024 Alcohol dependence with alcohol-induced mood disorder: Secondary | ICD-10-CM | POA: Diagnosis present

## 2017-05-03 DIAGNOSIS — E669 Obesity, unspecified: Secondary | ICD-10-CM | POA: Diagnosis present

## 2017-05-03 DIAGNOSIS — M6282 Rhabdomyolysis: Secondary | ICD-10-CM | POA: Diagnosis present

## 2017-05-03 DIAGNOSIS — I481 Persistent atrial fibrillation: Secondary | ICD-10-CM | POA: Diagnosis present

## 2017-05-03 DIAGNOSIS — D649 Anemia, unspecified: Secondary | ICD-10-CM | POA: Diagnosis present

## 2017-05-03 DIAGNOSIS — D696 Thrombocytopenia, unspecified: Secondary | ICD-10-CM | POA: Diagnosis present

## 2017-05-03 HISTORY — DX: Alcohol dependence with withdrawal delirium: F10.231

## 2017-05-03 HISTORY — DX: Alcohol abuse, uncomplicated: F10.10

## 2017-05-03 LAB — COMPREHENSIVE METABOLIC PANEL
ALT: 25 U/L (ref 17–63)
AST: 66 U/L — ABNORMAL HIGH (ref 15–41)
Albumin: 4.2 g/dL (ref 3.5–5.0)
Alkaline Phosphatase: 48 U/L (ref 38–126)
Anion gap: 26 — ABNORMAL HIGH (ref 5–15)
BUN: 55 mg/dL — ABNORMAL HIGH (ref 6–20)
CO2: 21 mmol/L — ABNORMAL LOW (ref 22–32)
Calcium: 5.8 mg/dL — CL (ref 8.9–10.3)
Chloride: 76 mmol/L — ABNORMAL LOW (ref 101–111)
Creatinine, Ser: 3.77 mg/dL — ABNORMAL HIGH (ref 0.61–1.24)
GFR calc Af Amer: 21 mL/min — ABNORMAL LOW (ref >60)
GFR calc non Af Amer: 18 mL/min — ABNORMAL LOW (ref >60)
Glucose, Bld: 137 mg/dL — ABNORMAL HIGH (ref 65–99)
Potassium: 2.9 mmol/L — ABNORMAL LOW (ref 3.5–5.1)
Sodium: 123 mmol/L — ABNORMAL LOW (ref 135–145)
Total Bilirubin: 1.9 mg/dL — ABNORMAL HIGH (ref 0.3–1.2)
Total Protein: 8.3 g/dL — ABNORMAL HIGH (ref 6.5–8.1)

## 2017-05-03 LAB — CBC WITH DIFFERENTIAL/PLATELET
BASOS PCT: 0 %
Basophils Absolute: 0 10*3/uL (ref 0.0–0.1)
EOS PCT: 0 %
Eosinophils Absolute: 0 10*3/uL (ref 0.0–0.7)
HCT: 42 % (ref 39.0–52.0)
Hemoglobin: 15.2 g/dL (ref 13.0–17.0)
LYMPHS ABS: 3.2 10*3/uL (ref 0.7–4.0)
Lymphocytes Relative: 10 %
MCH: 31.7 pg (ref 26.0–34.0)
MCHC: 36.2 g/dL — AB (ref 30.0–36.0)
MCV: 87.5 fL (ref 78.0–100.0)
MONO ABS: 2.2 10*3/uL — AB (ref 0.1–1.0)
Monocytes Relative: 7 %
Neutro Abs: 26.4 10*3/uL — ABNORMAL HIGH (ref 1.7–7.7)
Neutrophils Relative %: 83 %
PLATELETS: 193 10*3/uL (ref 150–400)
RBC: 4.8 MIL/uL (ref 4.22–5.81)
RDW: 14.6 % (ref 11.5–15.5)
WBC: 31.8 10*3/uL — AB (ref 4.0–10.5)

## 2017-05-03 LAB — URINALYSIS, ROUTINE W REFLEX MICROSCOPIC
BILIRUBIN URINE: NEGATIVE
Bacteria, UA: NONE SEEN
Glucose, UA: NEGATIVE mg/dL
Ketones, ur: 5 mg/dL — AB
Leukocytes, UA: NEGATIVE
NITRITE: NEGATIVE
PH: 5 (ref 5.0–8.0)
Protein, ur: 30 mg/dL — AB
SPECIFIC GRAVITY, URINE: 1.016 (ref 1.005–1.030)

## 2017-05-03 LAB — I-STAT CG4 LACTIC ACID, ED: Lactic Acid, Venous: 6.28 mmol/L (ref 0.5–1.9)

## 2017-05-03 LAB — BASIC METABOLIC PANEL
ANION GAP: 18 — AB (ref 5–15)
ANION GAP: 16 — AB (ref 5–15)
BUN: 58 mg/dL — ABNORMAL HIGH (ref 6–20)
BUN: 59 mg/dL — ABNORMAL HIGH (ref 6–20)
CALCIUM: 5.1 mg/dL — AB (ref 8.9–10.3)
CO2: 21 mmol/L — ABNORMAL LOW (ref 22–32)
CO2: 22 mmol/L (ref 22–32)
Calcium: 5.1 mg/dL — CL (ref 8.9–10.3)
Chloride: 84 mmol/L — ABNORMAL LOW (ref 101–111)
Chloride: 85 mmol/L — ABNORMAL LOW (ref 101–111)
Creatinine, Ser: 3.59 mg/dL — ABNORMAL HIGH (ref 0.61–1.24)
Creatinine, Ser: 3.24 mg/dL — ABNORMAL HIGH (ref 0.61–1.24)
GFR calc non Af Amer: 19 mL/min — ABNORMAL LOW (ref >60)
GFR calc non Af Amer: 22 mL/min — ABNORMAL LOW (ref >60)
GFR, EST AFRICAN AMERICAN: 22 mL/min — AB (ref >60)
GFR, EST AFRICAN AMERICAN: 25 mL/min — AB (ref >60)
GLUCOSE: 118 mg/dL — AB (ref 65–99)
GLUCOSE: 104 mg/dL — AB (ref 65–99)
POTASSIUM: 3 mmol/L — AB (ref 3.5–5.1)
POTASSIUM: 2.9 mmol/L — AB (ref 3.5–5.1)
Sodium: 123 mmol/L — ABNORMAL LOW (ref 135–145)
Sodium: 123 mmol/L — ABNORMAL LOW (ref 135–145)

## 2017-05-03 LAB — LIPASE, BLOOD: LIPASE: 161 U/L — AB (ref 11–51)

## 2017-05-03 LAB — PROTIME-INR
INR: 1.17
PROTHROMBIN TIME: 14.8 s (ref 11.4–15.2)

## 2017-05-03 LAB — PROCALCITONIN: Procalcitonin: 1.79 ng/mL

## 2017-05-03 LAB — RAPID URINE DRUG SCREEN, HOSP PERFORMED
Amphetamines: NOT DETECTED
BARBITURATES: NOT DETECTED
Benzodiazepines: NOT DETECTED
Cocaine: NOT DETECTED
Opiates: NOT DETECTED
TETRAHYDROCANNABINOL: POSITIVE — AB

## 2017-05-03 LAB — CK: Total CK: 2539 U/L — ABNORMAL HIGH (ref 49–397)

## 2017-05-03 LAB — BRAIN NATRIURETIC PEPTIDE: B Natriuretic Peptide: 98.5 pg/mL (ref 0.0–100.0)

## 2017-05-03 LAB — I-STAT TROPONIN, ED: Troponin i, poc: 0 ng/mL (ref 0.00–0.08)

## 2017-05-03 LAB — PHOSPHORUS: Phosphorus: 3 mg/dL (ref 2.5–4.6)

## 2017-05-03 LAB — MAGNESIUM
MAGNESIUM: 3.2 mg/dL — AB (ref 1.7–2.4)
MAGNESIUM: 1.5 mg/dL — AB (ref 1.7–2.4)
Magnesium: 0.6 mg/dL — CL (ref 1.7–2.4)

## 2017-05-03 LAB — ETHANOL: Alcohol, Ethyl (B): <10 mg/dL (ref <10)

## 2017-05-03 LAB — LACTIC ACID, PLASMA: Lactic Acid, Venous: 1.7 mmol/L (ref 0.5–1.9)

## 2017-05-03 LAB — MRSA PCR SCREENING: MRSA BY PCR: INVALID — AB

## 2017-05-03 MED ORDER — POTASSIUM CHLORIDE 10 MEQ/100ML IV SOLN
10.0000 meq | Freq: Once | INTRAVENOUS | Status: AC
  Administered 2017-05-03: 10 meq via INTRAVENOUS
  Filled 2017-05-03: qty 100

## 2017-05-03 MED ORDER — SODIUM CHLORIDE 0.9 % IV BOLUS (SEPSIS)
1000.0000 mL | Freq: Once | INTRAVENOUS | Status: AC
  Administered 2017-05-03: 1000 mL via INTRAVENOUS

## 2017-05-03 MED ORDER — LORAZEPAM 2 MG/ML IJ SOLN
1.0000 mg | Freq: Once | INTRAMUSCULAR | Status: AC
  Administered 2017-05-03: 1 mg via INTRAVENOUS
  Filled 2017-05-03: qty 1

## 2017-05-03 MED ORDER — DILTIAZEM HCL-DEXTROSE 100-5 MG/100ML-% IV SOLN (PREMIX)
5.0000 mg/h | INTRAVENOUS | Status: DC
  Administered 2017-05-03 – 2017-05-04 (×2): 5 mg/h via INTRAVENOUS
  Filled 2017-05-03 (×2): qty 100

## 2017-05-03 MED ORDER — MAGNESIUM SULFATE 4 GM/100ML IV SOLN
4.0000 g | Freq: Once | INTRAVENOUS | Status: AC
  Administered 2017-05-03: 4 g via INTRAVENOUS
  Filled 2017-05-03: qty 100

## 2017-05-03 MED ORDER — LORAZEPAM 2 MG/ML IJ SOLN
1.0000 mg | INTRAMUSCULAR | Status: DC | PRN
  Administered 2017-05-04 (×2): 2 mg via INTRAVENOUS
  Filled 2017-05-03 (×2): qty 1

## 2017-05-03 MED ORDER — ADULT MULTIVITAMIN W/MINERALS CH
1.0000 | ORAL_TABLET | Freq: Every day | ORAL | Status: DC
  Administered 2017-05-03 – 2017-05-04 (×2): 1 via ORAL
  Filled 2017-05-03 (×2): qty 1

## 2017-05-03 MED ORDER — THIAMINE HCL 100 MG/ML IJ SOLN
100.0000 mg | Freq: Every day | INTRAMUSCULAR | Status: DC
  Administered 2017-05-03 – 2017-05-04 (×2): 100 mg via INTRAVENOUS
  Filled 2017-05-03: qty 2

## 2017-05-03 MED ORDER — DILTIAZEM LOAD VIA INFUSION
10.0000 mg | Freq: Once | INTRAVENOUS | Status: AC
  Administered 2017-05-03: 10 mg via INTRAVENOUS
  Filled 2017-05-03: qty 10

## 2017-05-03 MED ORDER — DEXTROSE 5 % IV SOLN
2.0000 g | INTRAVENOUS | Status: DC
  Administered 2017-05-03: 2 g via INTRAVENOUS
  Filled 2017-05-03 (×2): qty 2

## 2017-05-03 MED ORDER — SODIUM CHLORIDE 0.9 % IV SOLN
1.0000 g | Freq: Once | INTRAVENOUS | Status: AC
  Administered 2017-05-03: 1 g via INTRAVENOUS
  Filled 2017-05-03: qty 10

## 2017-05-03 MED ORDER — POTASSIUM CHLORIDE 10 MEQ/100ML IV SOLN
10.0000 meq | INTRAVENOUS | Status: AC
  Administered 2017-05-03 (×3): 10 meq via INTRAVENOUS
  Filled 2017-05-03 (×2): qty 100

## 2017-05-03 MED ORDER — HEPARIN SODIUM (PORCINE) 5000 UNIT/ML IJ SOLN
5000.0000 [IU] | Freq: Three times a day (TID) | INTRAMUSCULAR | Status: DC
  Administered 2017-05-03 – 2017-05-04 (×4): 5000 [IU] via SUBCUTANEOUS
  Filled 2017-05-03 (×4): qty 1

## 2017-05-03 MED ORDER — LORAZEPAM 2 MG/ML IJ SOLN: 2.0000 mg | Freq: Once | INTRAMUSCULAR | Status: DC

## 2017-05-03 MED ORDER — SODIUM CHLORIDE 0.9 % IV SOLN
INTRAVENOUS | Status: DC
  Administered 2017-05-03 (×2): via INTRAVENOUS

## 2017-05-03 MED ORDER — ONDANSETRON HCL 4 MG/2ML IJ SOLN
4.0000 mg | Freq: Once | INTRAMUSCULAR | Status: AC
  Administered 2017-05-03: 4 mg via INTRAVENOUS
  Filled 2017-05-03: qty 2

## 2017-05-03 MED ORDER — ZOLPIDEM TARTRATE 5 MG PO TABS
5.0000 mg | ORAL_TABLET | Freq: Every evening | ORAL | Status: DC | PRN
  Administered 2017-05-03: 5 mg via ORAL
  Filled 2017-05-03: qty 1

## 2017-05-03 MED ORDER — PANTOPRAZOLE SODIUM 40 MG IV SOLR
40.0000 mg | Freq: Every day | INTRAVENOUS | Status: DC
  Administered 2017-05-03: 40 mg via INTRAVENOUS
  Filled 2017-05-03: qty 40

## 2017-05-03 MED ORDER — FOLIC ACID 5 MG/ML IJ SOLN
1.0000 mg | Freq: Every day | INTRAMUSCULAR | Status: DC
  Administered 2017-05-03: 1 mg via INTRAVENOUS
  Filled 2017-05-03 (×2): qty 0.2

## 2017-05-03 NOTE — Progress Notes (Signed)
CRITICAL VALUE ALERT  Critical Value:  Calcium 5.1  Date & Time Notied:  05/03/2017  1745  Provider Notified: Dr. Vassie Loll  Orders Received/Actions taken: 1 gram of Calcium gluconate ordered.

## 2017-05-03 NOTE — Consult Note (Signed)
PULMONARY / CRITICAL CARE MEDICINE   Name: Juan Mathews MRN: [id number] DOB: 1970/12/13    ADMISSION DATE:  05/03/2017 CONSULTATION DATE:  05/03/2017  REFERRING MD:  Dr. Rubin Payor / EDP  CHIEF COMPLAINT:  Chest pain, SOB  HISTORY OF PRESENT ILLNESS: 47 year old male presented to [facility] ED 1/24 with chest pain and SOB that began the evening of 1/23.   The patient reports he normally drinks 3-4 times per week.  He usually drinks vodka, wine and beer.  He has had the "shakes" 2 times in the last 3-4 months.  He was previously working as a [employer] but recently lost his job.  He has been drinking more since he lost his job.   The patient reports he began having cramping of the arms/hands, abdominal cramping about 3-4 days before presentation with nausea/vomiting.  He reports his hands would "draw up".  He thought he felt better the am of presentation but began having palpitations and chest discomfort prompting an ER evaluation.  With his job loss, he does not currently have insurance and he is concerned about his COBRA coverage as there was a mix up.    He has a history of non-ischemic cardiomyopathy, AF on xarelto followed by Dr. Donnie Aho.    Initial ED labs significant for lactate of 6.28, Na of 123, K of 2.9, Cl of 76, Mg of .6, Ca of 5.8, CO2 21 BUN 55, creatinine 3.77, anion gap of 26 and WBC of 31.8. BNP, troponin negative, EKG showing Afib. LFT showed mildly elevated AST and alcohol <10.  He was treated with a cardizem bolus and gtt.    PCCM called for ICU admission.   PAST MEDICAL HISTORY :  He  has a past medical history of Atrial fibrillation (HCC), CHF (congestive heart failure) (HCC), Chronic systolic CHF (congestive heart failure) (HCC) (07/28/2015), Obesity (BMI 30-39.9), and Persistent atrial fibrillation (HCC) (07/28/2015).  PAST SURGICAL HISTORY: He  has no past surgical history on file.  No Known Allergies  No current facility-administered medications on file prior to  encounter.    Current Outpatient Medications on File Prior to Encounter  Medication Sig  . furosemide (LASIX) 40 MG tablet Take 1 tablet (40 mg total) by mouth daily.  Marland Kitchen ibuprofen (ADVIL,MOTRIN) 200 MG tablet Take 400-600 mg by mouth every 6 (six) hours as needed for mild pain.  Marland Kitchen lisinopril (PRINIVIL,ZESTRIL) 2.5 MG tablet Take 2.5 mg by mouth daily.   . metoprolol succinate (TOPROL-XL) 50 MG 24 hr tablet Take 50 mg by mouth 2 (two) times daily.   Marland Kitchen spironolactone (ALDACTONE) 25 MG tablet Take 1 tablet (25 mg total) by mouth daily.  . Suvorexant (BELSOMRA) 10 MG TABS Take 10 mg by mouth at bedtime as needed (sleep).  Carlena Hurl 20 MG TABS tablet Take 20 mg by mouth daily.    FAMILY HISTORY:  His indicated that his mother is alive. He indicated that his father is alive. He indicated that his brother is alive.   SOCIAL HISTORY: He  reports that he has been smoking.  He does not have any smokeless tobacco history on file. He reports that he drinks alcohol. He reports that he uses drugs. Drug: Marijuana.  REVIEW OF SYSTEMS:   Gen: Denies fever, chills, weight change, fatigue, night sweats HEENT: Denies blurred vision, double vision, hearing loss, tinnitus, sinus congestion, rhinorrhea, sore throat, neck stiffness, dysphagia PULM: Denies shortness of breath, cough, sputum production, hemoptysis, wheezing CV: Denies chest pain, edema, orthopnea, paroxysmal nocturnal  dyspnea, palpitations GI: Denies abdominal pain, nausea, vomiting, diarrhea, hematochezia, melena, constipation, change in bowel habits GU: Denies dysuria, hematuria, polyuria, oliguria, urethral discharge Endocrine: Denies hot or cold intolerance, polyuria, polyphagia or appetite change Derm: Denies rash, dry skin, scaling or peeling skin change Heme: Denies easy bruising, bleeding, bleeding gums Neuro: Denies headache, numbness, weakness, slurred speech, loss of memory or consciousness   SUBJECTIVE:   VITAL SIGNS: BP  100/77   Pulse 81   Temp 97.9 F (36.6 C) (Rectal)   Resp (!) 29   SpO2 92%   HEMODYNAMICS:    VENTILATOR SETTINGS:    INTAKE / OUTPUT: No intake/output data recorded.  PHYSICAL EXAMINATION: General: adult male in NAD HEENT: MM pink/dry, no jvd, ruddy face PSY: calm Neuro: AAOx4, speech clear, mild tremor CV: s1s2 rrr, no m/r/g PULM: even/non-labored, lungs bilaterally clear  BM:WUXL, non-tender, bsx4 active  Extremities: warm/dry, no edema  Skin: no rashes or lesions  LABS:  BMET Recent Labs  Lab 05/03/17 1116  NA 123*  K 2.9*  CL 76*  CO2 21*  BUN 55*  CREATININE 3.77*  GLUCOSE 137*    Electrolytes Recent Labs  Lab 05/03/17 1116  CALCIUM 5.8*  MG 0.6*    CBC Recent Labs  Lab 05/03/17 1116  WBC 31.8*  HGB 15.2  HCT 42.0  PLT 193    Coag's No results for input(s): APTT, INR in the last 168 hours.  Sepsis Markers Recent Labs  Lab 05/03/17 1156  LATICACIDVEN 6.28*    ABG No results for input(s): PHART, PCO2ART, PO2ART in the last 168 hours.  Liver Enzymes Recent Labs  Lab 05/03/17 1116  AST 66*  ALT 25  ALKPHOS 48  BILITOT 1.9*  ALBUMIN 4.2    Cardiac Enzymes No results for input(s): TROPONINI, PROBNP in the last 168 hours.  Glucose No results for input(s): GLUCAP in the last 168 hours.  Imaging Dg Chest Portable 1 View  Result Date: 05/03/2017 CLINICAL DATA:  Chest pain and SOB EXAM: PORTABLE CHEST 1 VIEW COMPARISON:  07/28/2015 FINDINGS: The heart size and mediastinal contours are within normal limits. Both lungs are clear. The visualized skeletal structures are unremarkable. IMPRESSION: No active disease. Electronically Signed   By: Signa Kell M.D.   On: 05/03/2017 11:41     STUDIES:    CULTURES: BCx2 1/24 >> UA 1/24 >>  ANTIBIOTICS:   SIGNIFICANT EVENTS: 1/24  Admit   LINES/TUBES:   DISCUSSION: 47 y/o M admitted with AFwRVR in the setting of volume depletion (lasix, decreased PO intake), profound  electrolyte disturbance and AKI (ACE-I, lopressor, volume depletion).    ASSESSMENT / PLAN:  PULMONARY A: Tachypnea - in setting of volume depletion, AFwRVR P:   Pulmonary hygiene   CARDIOVASCULAR A:  AFwRVR NICM  Hypertension P:  SDU monitoring  Correct electrolyte disturbances  Cardiology consultation, appreciate input  Monitor QTc  Cardizem gtt  RENAL A:   Hypomagnesium  Hypocalcemia  Hypokalemia AKI R/O Rhabdomyolysis  P:   Trend BMP / urinary output Replace electrolytes as indicated Avoid nephrotoxic agents, ensure adequate renal perfusion Now Mg, K, Ca+ NS @ 150 ml/hr Assess CK, lactic acid   GASTROINTESTINAL A:   Nausea / Vomiting  Obesity R/O Pancreatitis P:   Full liquid diet  PRN zofran  Assess lipase   HEMATOLOGIC A:   No acute issues  P:  Trend CBC Folate / Thiamine in setting of ETOH Heparin sq for DVT prophylaxis   INFECTIOUS A:   No  evidence of acute infectious process  P:   Assess blood cultures, urinalysis Hold abx for now   ENDOCRINE A:   At Risk Hypo/Hyperglycemia    P:   Trend glucose   NEUROLOGIC A:   Hx ETOH Abuse  P:   RASS goal: n/a  CIWA protocol  Ativan Q1 PRN CIWA >8  FAMILY  - Updates: Patient updated on plan of care.  He reports he has a brother.  Is separated from his wife.  Has one child.  Has a girlfriend.    Canary Brim, NP-C [facility] Pgr: [phone] or if no answer [phone] 05/03/2017, 3:02 PM

## 2017-05-03 NOTE — ED Notes (Signed)
Diltiazem holding at 5mg /hr dose. HR 100, SBP 100.

## 2017-05-03 NOTE — ED Triage Notes (Signed)
Chest pain and SOB started last night and got worse this morning. Hx of Afib.

## 2017-05-03 NOTE — H&P (Signed)
Independently examined pt, evaluated data & formulated above care plan with NP/resident    46 year old man with nonischemic cardiomyopathy diagnosed about a year ago, presents with nausea vomiting, tremors and spasms in his hands and tingling. Labs in the ED showed lactic acidosis with acute kidney injury and creatinine of 3.7 with severe hypokalemia, hypomagnesemia and hypocalcemia He was noted to be in atrial fibrillation/RVR  170s and started on a Cardizem drip Fluid bolus of 2 L was given. On exam-awake, alert, interactive, tremors, Trousseau sign positive , clear breath sounds, decreased bilateral, soft and nontender abdomen, no pallor or icterus.  Labs reviewed Chest x-ray without infiltrates.  Impression/plan  Lactic acidosis- likely related to dehydration, from preceding nausea vomiting. Doubt sepsis but will obtain blood cultures and repeat lactate, has received 2 L saline already Obtain urinalysis for completion and will start empiric antibiotics based on above  Alcohol withdrawal/alcoholic gastritis -IV Protonix. Ativan as needed withdrawal/such withdrawal was 3 months ago  Nonischemic cardia myopathy/atrial fibrillation/RVR-continue IV Cardizem drip. Will likely need IV heparin, he is supposed to be on Xarelto but not taking. Cardiology input eventually.   Severe hypocalcemia with tetany/hypomagnesemia and hypokalemia-low magnesium may be driving this, replete aggressively with 4 g and then recheck in the setting of renal failure 1 g of calcium is being given, will give 4 g of potassium.  AK I -fluid resuscitation, follow bmet every 6 hours  Admit to ICU given above  The patient is critically ill with multiple organ systems failure and requires high complexity decision making for assessment and support, frequent evaluation and titration of therapies, application of advanced monitoring technologies and extensive interpretation of multiple databases. Critical Care Time  devoted to patient care services described in this note independent of APP/resident  time is 45 minutes.    Comer Locket Vassie Loll MD

## 2017-05-03 NOTE — Consult Note (Signed)
Cardiology Consultation:   Patient ID: Juan Mathews; [id number]; 03-11-1971   Admit date: 05/03/2017 Date of Consult: 05/03/2017  Primary Care Provider: Patient, No Pcp Per Primary Cardiologist: Dr Donnie Aho Primary Electrophysiologist:  n/a   Patient Profile:   Juan Mathews is a 47 y.o. male with a hx of S-CHF, presumed NICM from persistent afib, HTN, noncompliance (partly financial) who is being seen today for the evaluation of rapid atrial fib at the request of Dr Vassie Loll.  History of Present Illness:   Juan Mathews has been drinking more since he lost his job and has had poor med compliance and appt compliance.  This is partly related to financial issues.  He cannot afford the Xarelto and has not taken it recently.  He developed an acute illness several days ago. N&V, no diarrhea. He has been unable to keep water down at times.  Because of the illness, he has not had alcohol in several days.  He has felt his heart race at times, especially when he lays down to go to sleep.  He was having trouble sleeping at night and was very short of breath with exertion.  He developed cramping in his arms and hands as well as abdominal cramping for the last several days.  He also developed chest pain and the palpitations got worse.  He finally came to the ER.    In the ER, his initial heart rate was 172.  He was started on Cardizem but his blood pressure dropped.  He is currently on Cardizem at 5 mg an hour, his heart rate is in the low 100s but his systolic blood pressure is in the 80s and 90s.  He has significant electrolyte imbalances with a potassium of 2.9, a magnesium of 0.6, and a creatinine of 3.77, among others.  His lactic acid level was 6.28 and his white count was 31,000.  In the ER, he has gotten 3 mg of Ativan, 4 mg of Zofran, 1 g of calcium gluconate and 4 g of magnesium.  He has also gotten a total of 2 L of normal saline.  The tetany and cramping in his hands is improving.  He is still  nauseated and vomiting, but does not feel quite as bad.    Past Medical History:  Diagnosis Date  . Chronic systolic CHF (congestive heart failure) (HCC) 07/28/2015  . ETOH abuse   . Obesity (BMI 30-39.9)   . Persistent atrial fibrillation (HCC) 07/28/2015   Diagnosed 2016 in [place] went off treatment and moved here  CHA2DS2VASC score 1     History reviewed. No pertinent surgical history.   Prior to Admission medications   Medication Sig Start Date End Date Taking? Authorizing Provider  furosemide (LASIX) 40 MG tablet Take 1 tablet (40 mg total) by mouth daily. 07/30/15  Yes Othella Boyer, MD  ibuprofen (ADVIL,MOTRIN) 200 MG tablet Take 400-600 mg by mouth every 6 (six) hours as needed for mild pain.   Yes [provider]  lisinopril (PRINIVIL,ZESTRIL) 2.5 MG tablet Take 2.5 mg by mouth daily.    Yes [provider]  metoprolol succinate (TOPROL-XL) 50 MG 24 hr tablet Take 50 mg by mouth 2 (two) times daily.    Yes [provider]  spironolactone (ALDACTONE) 25 MG tablet Take 1 tablet (25 mg total) by mouth daily. 07/30/15  Yes Othella Boyer, MD  Suvorexant (BELSOMRA) 10 MG TABS Take 10 mg by mouth at bedtime as needed (sleep).   Yes [provider]  XARELTO 20 MG TABS tablet Take 20 mg by mouth daily. 07/19/15  Yes [provider]    Inpatient Medications: Scheduled Meds: . folic acid  1 mg Intravenous Daily  . heparin  5,000 Units Subcutaneous Q8H  . multivitamin with minerals  1 tablet Oral Daily  . pantoprazole (PROTONIX) IV  40 mg Intravenous QHS  . thiamine injection  100 mg Intravenous Daily   Continuous Infusions: . sodium chloride    . diltiazem (CARDIZEM) infusion 5 mg/hr (05/03/17 1244)  . magnesium sulfate 1 - 4 g bolus IVPB 4 g (05/03/17 1513)  . potassium chloride     PRN Meds: LORazepam  Allergies:   No Known Allergies  Social History:   Social History   Socioeconomic History  . Marital status: Married      Spouse name: Not on file  . Number of children: Not on file  . Years of education: Not on file  . Highest education level: Not on file  Social Needs  . Financial resource strain: Not on file  . Food insecurity - worry: Not on file  . Food insecurity - inability: Not on file  . Transportation needs - medical: Not on file  . Transportation needs - non-medical: Not on file  Occupational History  . Not on file  Tobacco Use  . Smoking status: Current Every Day Smoker    Types: Cigarettes  . Smokeless tobacco: Never Used  Substance and Sexual Activity  . Alcohol use: Yes    Alcohol/week: 0.0 oz    Comment:    . Drug use: Yes    Types: Marijuana  . Sexual activity: Not on file  Other Topics Concern  . Not on file  Social History Narrative  . Not on file    Family History:   Family History  Problem Relation Age of Onset  . CAD Neg Hx        Parents   Family Status:  Family Status  Relation Name Status  . Father  Alive  . Mother  Alive  . Brother  Alive  . Neg Hx  (Not Specified)    ROS:  Please see the history of present illness.  All other ROS reviewed and negative.     Physical Exam/Data:   Vitals:   05/03/17 1301 05/03/17 1315 05/03/17 1445 05/03/17 1500  BP: 100/77 (!) 122/92 (!) 147/92 (!) 92/59  Pulse: 81 (!) 101 (!) 129 (!) 35  Resp: (!) 29 (!) 23 (!) 24 (!) 23  Temp:      TempSrc:      SpO2: 92% 98% 93% 95%    Intake/Output Summary (Last 24 hours) at 05/03/2017 1551 Last data filed at 05/03/2017 1510 Gross per 24 hour  Intake 2210 ml  Output -  Net 2210 ml   There were no vitals filed for this visit. There is no height or weight on file to calculate BMI.  General:  Well nourished, well developed, in no acute distress HEENT: normal Lymph: no adenopathy Neck: no JVD Endocrine:  No thryomegaly Vascular: No carotid bruits; 4/4 extremity pulses 2+, without bruits  Cardiac:  normal S1, S2; rapid and irregular R&R; no murmur  Lungs:  clear to  auscultation bilaterally, no wheezing, rhonchi or rales  Abd: soft, nontender, no hepatomegaly  Ext: no edema Musculoskeletal:  No deformities, BUE and BLE strength normal and equal, mild tetany seen, improved Skin: warm and dry  Neuro:  CNs 2-12 intact, no focal abnormalities noted Psych:  Normal affect   EKG:  The EKG was personally reviewed and demonstrates:  Atrial fib, RVR, HR 176 Telemetry:  Telemetry was personally reviewed and demonstrates:  Atrial fib, HR approx 100 but is up and down  Relevant CV Studies:  ECHO: 07/29/2015 - Left ventricle: The cavity size was moderately dilated. Wall   thickness was increased in a pattern of mild LVH. Systolic   function was normal. The estimated ejection fraction was in the   range of 10% to 15%. Severe diffuse hypokinesis. Although no   diagnostic regional wall motion abnormality was identified, this   possibility cannot be completely excluded on the basis of this   study. - Mitral valve: There was moderate regurgitation. - Left atrium: The atrium was severely dilated. - Right atrium: The atrium was mildly to moderately dilated. - Pulmonary arteries: Systolic pressure was mildly increased. PA   peak pressure: 33 mm Hg (S).   Laboratory Data:  Chemistry Recent Labs  Lab 05/03/17 1116  NA 123*  K 2.9*  CL 76*  CO2 21*  GLUCOSE 137*  BUN 55*  CREATININE 3.77*  CALCIUM 5.8*  GFRNONAA 18*  GFRAA 21*  ANIONGAP 26*    Lab Results  Component Value Date   ALT 25 05/03/2017   AST 66 (H) 05/03/2017   ALKPHOS 48 05/03/2017   BILITOT 1.9 (H) 05/03/2017   Hematology Recent Labs  Lab 05/03/17 1116  WBC 31.8*  RBC 4.80  HGB 15.2  HCT 42.0  MCV 87.5  MCH 31.7  MCHC 36.2*  RDW 14.6  PLT 193   Cardiac EnzymesNo results for input(s): TROPONINI in the last 168 hours.  Recent Labs  Lab 05/03/17 1127  TROPIPOC 0.00    BNP Recent Labs  Lab 05/03/17 1116  BNP 98.5    TSH:  Lab Results  Component Value Date   TSH  1.562 07/30/2015   Magnesium:  Magnesium  Date Value Ref Range Status  05/03/2017 0.6 (LL) 1.7 - 2.4 mg/dL Final    Comment:    CRITICAL RESULT CALLED TO, READ BACK BY AND VERIFIED WITH: S.WEST AT 1218 ON 05/03/17 BY N.THOMPSON      Radiology/Studies:  Dg Chest Portable 1 View  Result Date: 05/03/2017 CLINICAL DATA:  Chest pain and SOB EXAM: PORTABLE CHEST 1 VIEW COMPARISON:  07/28/2015 FINDINGS: The heart size and mediastinal contours are within normal limits. Both lungs are clear. The visualized skeletal structures are unremarkable. IMPRESSION: No active disease. Electronically Signed   By: Signa Kell M.D.   On: 05/03/2017 11:41    Assessment and Plan:   Principal Problem: 1.  Hypocalcemia syndrome - per CCM - sx have begun to improve  Active Problems: 2.  Chronic systolic CHF (congestive heart failure) (HCC) - has not been weighing daily - not been taking rx, with financial issues, needs to be on inexpensive rx - make sure to get daily wts and follow volume status closely  3.  Persistent atrial fibrillation (HCC) - continue Cardizem, tolerate SBP 90s and high 80s till volume is improved. - then use rate control, do not think anticoagulation will be consistent so cannot do DCCV  4. Anticoag - risks outweigh benefits for ongoing anticoag - per CCM in-hospital - CHA2DS2VASc=1  (CHF)   For questions or updates, please contact [facility] Please consult [email or web address] for contact info under Cardiology/STEMI.   Melida Quitter, PA-C  05/03/2017 3:51 PM

## 2017-05-03 NOTE — ED Provider Notes (Signed)
[facility]-EMERGENCY DEPT Provider Note   CSN: [id number] Arrival date & time: 05/03/17  1058     History   Chief Complaint Chief Complaint  Patient presents with  . Shortness of Breath  . Chest Pain    HPI Juan Mathews is a 47 y.o. male.  HPI Patient presents with shortness of breath chest pain.  Began to feel bad last night.  History of atrial fibrillation.  Has been off his Xarelto.  He also is a heavy drinker.  Has had no drinks for the last 3 days.  Drinks about a half of 1/5 of vodka a day.  Has had withdrawals in the past.  No hallucinations.  He is diaphoretic and tachycardic with a heart rate of around 170.  States he feels as if he is in heart failure also.  States he is put on around 40 pounds.   he has had nausea and vomiting and feels as if he could be dehydrated. Past Medical History:  Diagnosis Date  . Atrial fibrillation (HCC)   . CHF (congestive heart failure) (HCC)   . Chronic systolic CHF (congestive heart failure) (HCC) 07/28/2015  . Obesity (BMI 30-39.9)   . Persistent atrial fibrillation (HCC) 07/28/2015   Diagnosed 2016 in [place] went off treatment and moved here  CHA2DS2VASC score 1      Patient Active Problem List   Diagnosis Date Noted  . Alcohol abuse with alcohol-induced mood disorder (HCC) 06/18/2016  . Acute on chronic systolic congestive heart failure (HCC) 07/28/2015  . Chronic systolic CHF (congestive heart failure) (HCC) 07/28/2015  . Persistent atrial fibrillation (HCC) 07/28/2015  . Obesity (BMI 30-39.9) 07/28/2015  . Long term (current) use of anticoagulants 07/28/2015    History reviewed. No pertinent surgical history.     Home Medications    Prior to Admission medications   Medication Sig Start Date End Date Taking? Authorizing Provider  furosemide (LASIX) 40 MG tablet Take 1 tablet (40 mg total) by mouth daily. 07/30/15  Yes Othella Boyer, MD  ibuprofen (ADVIL,MOTRIN) 200 MG tablet Take 400-600 mg by  mouth every 6 (six) hours as needed for mild pain.   Yes [provider]  lisinopril (PRINIVIL,ZESTRIL) 2.5 MG tablet Take 2.5 mg by mouth daily.    Yes [provider]  metoprolol succinate (TOPROL-XL) 50 MG 24 hr tablet Take 50 mg by mouth 2 (two) times daily.    Yes [provider]  spironolactone (ALDACTONE) 25 MG tablet Take 1 tablet (25 mg total) by mouth daily. 07/30/15  Yes Othella Boyer, MD  Suvorexant (BELSOMRA) 10 MG TABS Take 10 mg by mouth at bedtime as needed (sleep).   Yes [provider]  XARELTO 20 MG TABS tablet Take 20 mg by mouth daily. 07/19/15  Yes [provider]    Family History History reviewed. No pertinent family history.  Social History Social History   Tobacco Use  . Smoking status: Current Every Day Smoker  Substance Use Topics  . Alcohol use: Yes    Alcohol/week: 0.0 oz    Comment: Heavy in [ast   . Drug use: Yes    Types: Marijuana     Allergies   Patient has no known allergies.   Review of Systems Review of Systems  Constitutional: Positive for appetite change and diaphoresis. Negative for fever.  HENT: Negative for congestion.   Respiratory: Negative for shortness of breath.   Cardiovascular: Positive for leg swelling. Negative for chest pain.  Gastrointestinal: Positive for nausea and vomiting.  Genitourinary: Negative for flank pain.  Musculoskeletal: Negative for back pain.  Neurological: Negative for light-headedness.  Hematological: Negative for adenopathy.  Psychiatric/Behavioral: Negative for confusion.     Physical Exam Updated Vital Signs BP (!) 122/92   Pulse (!) 101   Temp 97.9 F (36.6 C) (Rectal)   Resp (!) 23   SpO2 98%   Physical Exam  Constitutional: He appears well-developed.  Patient is diaphoretic  HENT:  Head: Normocephalic.  Neck: Neck supple.  Cardiovascular:  Irregular tachycardia  Pulmonary/Chest: Effort normal. He has no wheezes. He has no rales.    Abdominal: There is no tenderness.  Musculoskeletal:       Right lower leg: He exhibits edema.       Left lower leg: He exhibits edema.  Neurological: He is alert.  Skin:  Skin is somewhat cool.     ED Treatments / Results  Labs (all labs ordered are listed, but only abnormal results are displayed) Labs Reviewed  COMPREHENSIVE METABOLIC PANEL - Abnormal; Notable for the following components:      Result Value   Sodium 123 (*)    Potassium 2.9 (*)    Chloride 76 (*)    CO2 21 (*)    Glucose, Bld 137 (*)    BUN 55 (*)    Creatinine, Ser 3.77 (*)    Calcium 5.8 (*)    Total Protein 8.3 (*)    AST 66 (*)    Total Bilirubin 1.9 (*)    GFR calc non Af Amer 18 (*)    GFR calc Af Amer 21 (*)    Anion gap 26 (*)    All other components within normal limits  MAGNESIUM - Abnormal; Notable for the following components:   Magnesium 0.6 (*)    All other components within normal limits  CBC WITH DIFFERENTIAL/PLATELET - Abnormal; Notable for the following components:   WBC 31.8 (*)    MCHC 36.2 (*)    Neutro Abs 26.4 (*)    Monocytes Absolute 2.2 (*)    All other components within normal limits  I-STAT CG4 LACTIC ACID, ED - Abnormal; Notable for the following components:   Lactic Acid, Venous 6.28 (*)    All other components within normal limits  CULTURE, BLOOD (ROUTINE X 2)  CULTURE, BLOOD (ROUTINE X 2)  ETHANOL  BRAIN NATRIURETIC PEPTIDE  URINALYSIS, ROUTINE W REFLEX MICROSCOPIC  RAPID URINE DRUG SCREEN, HOSP PERFORMED  LIPASE, BLOOD  I-STAT TROPONIN, ED    EKG  EKG Interpretation  Date/Time:  Thursday May 03 2017 11:04:18 EST Ventricular Rate:  176 PR Interval:    QRS Duration: 112 QT Interval:  267 QTC Calculation: 462 R Axis:   65 Text Interpretation:  Atrial fibrillation with rapid V-rate Borderline intraventricular conduction delay Low voltage, precordial leads Repolarization abnormality, prob rate related Confirmed by Benjiman Core [phone]) on 05/03/2017  11:14:20 AM       Radiology Dg Chest Portable 1 View  Result Date: 05/03/2017 CLINICAL DATA:  Chest pain and SOB EXAM: PORTABLE CHEST 1 VIEW COMPARISON:  07/28/2015 FINDINGS: The heart size and mediastinal contours are within normal limits. Both lungs are clear. The visualized skeletal structures are unremarkable. IMPRESSION: No active disease. Electronically Signed   By: Signa Kell M.D.   On: 05/03/2017 11:41    Procedures Procedures (including critical care time)  Medications Ordered in ED Medications  diltiazem (CARDIZEM) 1 mg/mL load via infusion 10 mg (10 mg  Intravenous Bolus from Bag 05/03/17 1242)    And  diltiazem (CARDIZEM) 100 mg in dextrose 5% (1 mg/mL) infusion (5 mg/hr Intravenous New Bag/Given 05/03/17 1244)  sodium chloride 0.9 % bolus 1,000 mL (1,000 mLs Intravenous New Bag/Given 05/03/17 1307)  potassium chloride 10 mEq in 100 mL IVPB (10 mEq Intravenous New Bag/Given 05/03/17 1307)  calcium gluconate 1 g in sodium chloride 0.9 % 100 mL IVPB (not administered)  LORazepam (ATIVAN) injection 1 mg (1 mg Intravenous Given 05/03/17 1126)  sodium chloride 0.9 % bolus 1,000 mL (0 mLs Intravenous Stopped 05/03/17 1307)  LORazepam (ATIVAN) injection 1 mg (1 mg Intravenous Given 05/03/17 1155)  ondansetron (ZOFRAN) injection 4 mg (4 mg Intravenous Given 05/03/17 1307)  LORazepam (ATIVAN) injection 1 mg (1 mg Intravenous Given 05/03/17 1306)     Initial Impression / Assessment and Plan / ED Course  I have reviewed the triage vital signs and the nursing notes.  Pertinent labs & imaging results that were available during my care of the patient were reviewed by me and considered in my medical decision making (see chart for details).     Patient presents with chest pain shortness of breath.  Found to be in A. fib with RVR. Chadsvasc of 2.  Found to be in a new renal failure with creatinine of 3.7 up from a normal baseline.  Also hyponatremia hypocalcemia hypokalemia and  hypomagnesemia.  Has had nausea and vomiting.  Likely a component of alcohol withdrawal.  After he quit drinking began to have vomiting.  Given IV fluids.  However does have a reported ejection fraction of 10-15%.  No fever.  Benign abdominal exam.  Has elevated lactic acid elevated white count but not clearly infectious.  Heart rate improved on IV Cardizem.  Patient is no longer diaphoretic after the rate control fluids and Ativan.  Will admit to ICU.  CRITICAL CARE Performed by: Benjiman Core Total critical care time: 30 minutes Critical care time was exclusive of separately billable procedures and treating other patients. Critical care was necessary to treat or prevent imminent or life-threatening deterioration. Critical care was time spent personally by me on the following activities: development of treatment plan with patient and/or surrogate as well as nursing, discussions with consultants, evaluation of patient's response to treatment, examination of patient, obtaining history from patient or surrogate, ordering and performing treatments and interventions, ordering and review of laboratory studies, ordering and review of radiographic studies, pulse oximetry and re-evaluation of patient's condition.   Final Clinical Impressions(s) / ED Diagnoses   Final diagnoses:  Atrial fibrillation with rapid ventricular response (HCC)  Acute kidney injury (HCC)  Leukocytosis, unspecified type  Hypomagnesemia  Hypokalemia  Hypocalcemia  Alcohol withdrawal syndrome without complication [facility])    ED Discharge Orders    None      Benjiman Core, MD 05/03/17 1356

## 2017-05-03 NOTE — Progress Notes (Signed)
This RN informed Dr. Vassie Loll of pt being hypotensive at 1700.  74/33.  1L of Normal Saline bolus ordered.  Erick Blinks, RN

## 2017-05-04 ENCOUNTER — Inpatient Hospital Stay (HOSPITAL_COMMUNITY): Payer: Self-pay

## 2017-05-04 ENCOUNTER — Encounter (HOSPITAL_COMMUNITY): Payer: Self-pay | Admitting: Physician Assistant

## 2017-05-04 DIAGNOSIS — E872 Acidosis, unspecified: Secondary | ICD-10-CM | POA: Diagnosis present

## 2017-05-04 DIAGNOSIS — F10239 Alcohol dependence with withdrawal, unspecified: Secondary | ICD-10-CM

## 2017-05-04 DIAGNOSIS — F1010 Alcohol abuse, uncomplicated: Secondary | ICD-10-CM

## 2017-05-04 DIAGNOSIS — I426 Alcoholic cardiomyopathy: Secondary | ICD-10-CM | POA: Diagnosis present

## 2017-05-04 DIAGNOSIS — F1014 Alcohol abuse with alcohol-induced mood disorder: Secondary | ICD-10-CM

## 2017-05-04 DIAGNOSIS — I4891 Unspecified atrial fibrillation: Secondary | ICD-10-CM

## 2017-05-04 DIAGNOSIS — F10230 Alcohol dependence with withdrawal, uncomplicated: Secondary | ICD-10-CM

## 2017-05-04 DIAGNOSIS — F10931 Alcohol use, unspecified with withdrawal delirium: Secondary | ICD-10-CM

## 2017-05-04 DIAGNOSIS — E860 Dehydration: Secondary | ICD-10-CM | POA: Diagnosis present

## 2017-05-04 DIAGNOSIS — E876 Hypokalemia: Secondary | ICD-10-CM

## 2017-05-04 DIAGNOSIS — D649 Anemia, unspecified: Secondary | ICD-10-CM | POA: Diagnosis present

## 2017-05-04 DIAGNOSIS — D72829 Elevated white blood cell count, unspecified: Secondary | ICD-10-CM

## 2017-05-04 DIAGNOSIS — D696 Thrombocytopenia, unspecified: Secondary | ICD-10-CM

## 2017-05-04 DIAGNOSIS — F10930 Alcohol use, unspecified with withdrawal, uncomplicated: Secondary | ICD-10-CM

## 2017-05-04 DIAGNOSIS — F10939 Alcohol use, unspecified with withdrawal, unspecified: Secondary | ICD-10-CM | POA: Diagnosis present

## 2017-05-04 DIAGNOSIS — F10231 Alcohol dependence with withdrawal delirium: Secondary | ICD-10-CM

## 2017-05-04 DIAGNOSIS — N179 Acute kidney failure, unspecified: Secondary | ICD-10-CM | POA: Diagnosis present

## 2017-05-04 HISTORY — DX: Alcohol dependence with withdrawal delirium: F10.231

## 2017-05-04 HISTORY — DX: Alcohol use, unspecified with withdrawal delirium: F10.931

## 2017-05-04 LAB — CBC
HEMATOCRIT: 32.3 % — AB (ref 39.0–52.0)
Hemoglobin: 11.6 g/dL — ABNORMAL LOW (ref 13.0–17.0)
MCH: 31.2 pg (ref 26.0–34.0)
MCHC: 35.9 g/dL (ref 30.0–36.0)
MCV: 86.8 fL (ref 78.0–100.0)
Platelets: 116 10*3/uL — ABNORMAL LOW (ref 150–400)
RBC: 3.72 MIL/uL — ABNORMAL LOW (ref 4.22–5.81)
RDW: 14.6 % (ref 11.5–15.5)
WBC: 12.7 10*3/uL — ABNORMAL HIGH (ref 4.0–10.5)

## 2017-05-04 LAB — FOLATE: Folate: 5.6 ng/mL — ABNORMAL LOW (ref >5.9)

## 2017-05-04 LAB — BASIC METABOLIC PANEL
Anion gap: 14 (ref 5–15)
BUN: 47 mg/dL — AB (ref 6–20)
CALCIUM: 4.9 mg/dL — AB (ref 8.9–10.3)
CO2: 21 mmol/L — AB (ref 22–32)
CREATININE: 2.23 mg/dL — AB (ref 0.61–1.24)
Chloride: 89 mmol/L — ABNORMAL LOW (ref 101–111)
GFR calc non Af Amer: 34 mL/min — ABNORMAL LOW (ref >60)
GFR, EST AFRICAN AMERICAN: 39 mL/min — AB (ref >60)
GLUCOSE: 121 mg/dL — AB (ref 65–99)
Potassium: 3.2 mmol/L — ABNORMAL LOW (ref 3.5–5.1)
Sodium: 124 mmol/L — ABNORMAL LOW (ref 135–145)

## 2017-05-04 LAB — CREATININE, URINE, RANDOM: Creatinine, Urine: 105.32 mg/dL

## 2017-05-04 LAB — HIV ANTIBODY (ROUTINE TESTING W REFLEX): HIV SCREEN 4TH GENERATION: NONREACTIVE

## 2017-05-04 LAB — FERRITIN: FERRITIN: 1171 ng/mL — AB (ref 24–336)

## 2017-05-04 LAB — RETICULOCYTES
RBC.: 3.67 MIL/uL — AB (ref 4.22–5.81)
RETIC CT PCT: 1.4 % (ref 0.4–3.1)
Retic Count, Absolute: 51.4 10*3/uL (ref 19.0–186.0)

## 2017-05-04 LAB — IRON AND TIBC
Iron: 17 ug/dL — ABNORMAL LOW (ref 45–182)
SATURATION RATIOS: 8 % — AB (ref 17.9–39.5)
TIBC: 224 ug/dL — ABNORMAL LOW (ref 250–450)
UIBC: 207 ug/dL

## 2017-05-04 LAB — SODIUM, URINE, RANDOM: Sodium, Ur: <10 mmol/L

## 2017-05-04 LAB — OSMOLALITY, URINE: OSMOLALITY UR: 475 mosm/kg (ref 300–900)

## 2017-05-04 LAB — CK: Total CK: 3001 U/L — ABNORMAL HIGH (ref 49–397)

## 2017-05-04 LAB — VITAMIN B12: VITAMIN B 12: 505 pg/mL (ref 180–914)

## 2017-05-04 LAB — OSMOLALITY: OSMOLALITY: 268 mosm/kg — AB (ref 275–295)

## 2017-05-04 LAB — ALBUMIN: Albumin: 3.2 g/dL — ABNORMAL LOW (ref 3.5–5.0)

## 2017-05-04 LAB — PHOSPHORUS: Phosphorus: 1.6 mg/dL — ABNORMAL LOW (ref 2.5–4.6)

## 2017-05-04 LAB — MAGNESIUM: Magnesium: 1.9 mg/dL (ref 1.7–2.4)

## 2017-05-04 MED ORDER — CALCIUM CITRATE-VITAMIN D 500-500 MG-UNIT PO CHEW: 2.0000 | CHEWABLE_TABLET | Freq: Four times a day (QID) | ORAL | Status: DC

## 2017-05-04 MED ORDER — PANTOPRAZOLE SODIUM 40 MG PO TBEC
40.0000 mg | DELAYED_RELEASE_TABLET | Freq: Every day | ORAL | Status: DC
  Administered 2017-05-04: 40 mg via ORAL
  Filled 2017-05-04 (×2): qty 1

## 2017-05-04 MED ORDER — SODIUM CHLORIDE 0.9 % IV SOLN: INTRAVENOUS | Status: DC

## 2017-05-04 MED ORDER — HYDROXYZINE HCL 25 MG PO TABS
25.0000 mg | ORAL_TABLET | Freq: Four times a day (QID) | ORAL | Status: DC | PRN
Start: 2017-05-04 — End: 2017-05-05

## 2017-05-04 MED ORDER — METOPROLOL SUCCINATE ER 25 MG PO TB24
25.0000 mg | ORAL_TABLET | Freq: Two times a day (BID) | ORAL | Status: DC
  Administered 2017-05-04 (×2): 25 mg via ORAL
  Filled 2017-05-04 (×2): qty 1

## 2017-05-04 MED ORDER — CEFTRIAXONE SODIUM 2 G IJ SOLR
2.0000 g | Freq: Every day | INTRAMUSCULAR | Status: DC
  Administered 2017-05-05: 2 g via INTRAVENOUS
  Filled 2017-05-04: qty 2

## 2017-05-04 MED ORDER — CALCIUM CARBONATE ANTACID 500 MG PO CHEW
2.0000 | CHEWABLE_TABLET | Freq: Three times a day (TID) | ORAL | Status: DC
  Administered 2017-05-04 (×4): 400 mg via ORAL
  Filled 2017-05-04 (×4): qty 2

## 2017-05-04 MED ORDER — FOLIC ACID 1 MG PO TABS
1.0000 mg | ORAL_TABLET | Freq: Every day | ORAL | Status: DC
  Administered 2017-05-04: 1 mg via ORAL
  Filled 2017-05-04: qty 1

## 2017-05-04 MED ORDER — ONDANSETRON 4 MG PO TBDP
4.0000 mg | ORAL_TABLET | Freq: Four times a day (QID) | ORAL | Status: DC | PRN
Start: 2017-05-04 — End: 2017-05-05

## 2017-05-04 MED ORDER — LOPERAMIDE HCL 2 MG PO CAPS: 2.0000 mg | ORAL_CAPSULE | ORAL | Status: DC | PRN

## 2017-05-04 MED ORDER — THIAMINE HCL 100 MG/ML IJ SOLN
100.0000 mg | Freq: Once | INTRAMUSCULAR | Status: AC
  Filled 2017-05-04: qty 2

## 2017-05-04 MED ORDER — SODIUM CHLORIDE 0.9 % IV SOLN
4.0000 g | INTRAVENOUS | Status: DC
  Administered 2017-05-04 – 2017-05-05 (×2): 4 g via INTRAVENOUS
  Filled 2017-05-04 (×4): qty 40

## 2017-05-04 MED ORDER — MAGNESIUM SULFATE 4 GM/100ML IV SOLN
4.0000 g | Freq: Once | INTRAVENOUS | Status: AC
  Administered 2017-05-04: 4 g via INTRAVENOUS
  Filled 2017-05-04: qty 100

## 2017-05-04 MED ORDER — LORAZEPAM 2 MG/ML IJ SOLN
2.0000 mg | INTRAMUSCULAR | Status: DC | PRN
  Administered 2017-05-04 (×2): 2 mg via INTRAVENOUS
  Filled 2017-05-04 (×2): qty 1

## 2017-05-04 MED ORDER — CHLORDIAZEPOXIDE HCL 25 MG PO CAPS
25.0000 mg | ORAL_CAPSULE | Freq: Four times a day (QID) | ORAL | Status: DC | PRN
  Administered 2017-05-04: 25 mg via ORAL
  Filled 2017-05-04: qty 1

## 2017-05-04 MED ORDER — VITAMIN B-1 100 MG PO TABS
100.0000 mg | ORAL_TABLET | Freq: Every day | ORAL | Status: DC
  Filled 2017-05-04: qty 1

## 2017-05-04 MED ORDER — SODIUM CHLORIDE 0.9 % IV SOLN
2.0000 g | Freq: Once | INTRAVENOUS | Status: AC
  Administered 2017-05-04: 2 g via INTRAVENOUS
  Filled 2017-05-04: qty 20

## 2017-05-04 MED ORDER — MAGNESIUM SULFATE 2 GM/50ML IV SOLN
2.0000 g | Freq: Once | INTRAVENOUS | Status: AC
  Administered 2017-05-04: 2 g via INTRAVENOUS
  Filled 2017-05-04: qty 50

## 2017-05-04 MED ORDER — DEXTROSE 5 % IV SOLN
30.0000 mmol | Freq: Once | INTRAVENOUS | Status: AC
  Administered 2017-05-04: 30 mmol via INTRAVENOUS
  Filled 2017-05-04: qty 10

## 2017-05-04 MED ORDER — POTASSIUM CHLORIDE 10 MEQ/100ML IV SOLN
10.0000 meq | INTRAVENOUS | Status: AC
  Administered 2017-05-04 (×3): 10 meq via INTRAVENOUS
  Filled 2017-05-04 (×3): qty 100

## 2017-05-04 MED ORDER — POTASSIUM CHLORIDE CRYS ER 20 MEQ PO TBCR
40.0000 meq | EXTENDED_RELEASE_TABLET | Freq: Once | ORAL | Status: AC
  Administered 2017-05-04: 40 meq via ORAL
  Filled 2017-05-04: qty 2

## 2017-05-04 NOTE — Progress Notes (Addendum)
PROGRESS NOTE    Juan Mathews  RUE:[id number] DOB: 08-Jan-1971 DOA: 05/03/2017 PCP: Patient, No Pcp Per    Brief Narrative:  Patient is a 47 year old gentleman history of alcohol abuse, nonischemic cardiomyopathy, atrial fibrillation, chronic systolic heart failure EF of approximately 15% thought to be secondary to alcohol use, obesity presented to the ED with chest pain, shortness of breath, palpitations, nausea, vomiting, tremors and spasms in his hands and tingling.  In the ED patient noted to be in A. fib with RVR with heart rates in the 170s started on a Cardizem drip.  Labs did show acute renal failure with a creatinine of 3.7, lactic acidosis, severe hypokalemia, hypocalcemia, hypomagnesemia.  Patient admitted to the critical care service cardiology consulted.  Patient transferred to [facility] service on 05/04/2017.   Assessment & Plan:   Principal Problem:   Hypocalcemia syndrome Active Problems:   Persistent atrial fibrillation (HCC)   Chronic systolic CHF (congestive heart failure) (HCC)   Obesity (BMI 30-39.9)   Alcohol abuse with alcohol-induced mood disorder (HCC)   DTs (delirium tremens) (HCC)   Alcohol withdrawal syndrome with complication, with unspecified complication (HCC)   Hypomagnesemia   Hypophosphatemia   ARF (acute renal failure) (HCC)   Dehydration   Alcoholic cardiomyopathy (HCC)   Leukocytosis   Thrombocytopenia (HCC)   Anemia   Lactic acidosis   Alcohol withdrawal syndrome without complication (HCC)   Atrial fibrillation with rapid ventricular response (HCC)   Hypocalcemia   Hypokalemia  #1 hypocalcemia syndrome/severe hypocalcemia/hypomagnesemia/hypokalemia Questionable etiology.  Likely hypomagnesemia driving this syndrome likely secondary to alcohol abuse.  Patient received 2 g of IV calcium gluconate in the ED, IV magnesium and potassium.  Some clinical improvement.  Spasms have improved.  Patient still with some tremors.  Patient denies any  oral paresthesias.  We will check a intact PTH and ionized calcium, albumin.  Calcium this morning was 4.9, phosphorus at 1.6, magnesium at 1.9, potassium at 3.2.  We will give 2 g of IV calcium gluconate, 4 g of IV magnesium sulfate, 30 mmol of K-Phos, oral potassium 40 mEq p.o. x1 dose.  Placed on calcium citrate with vitamin D 2 tablets 4 times daily.  Monitor on telemetry.  Will consult with nephrology for further evaluation and management.  2.  A. fib with RVR CHA2DS2VASC score 1. Patient currently on a Cardizem drip per cardiology.  Patient per cardiology deemed not a anticoagulation candidate until he is able to show a significant period of abstinence from alcohol.  Per cardiology.  3.  Dehydration IV fluids.  4.  Acute renal failure Likely secondary to a prerenal azotemia secondary to dehydration in the setting of ACE inhibitor, ARB, Lasix, NSAIDS.  Diuretics and ACE inhibitor on hold.  Urine output of 1.150 L over the past 24 hours.  Patient currently on IV fluids.  Renal function slowly trending down.  Check a fractional excretion of sodium.  Check a renal ultrasound.  Consult with nephrology for further evaluation and management.  Monitor fluid status closely due to history of CHF.  5.  Leukocytosis Questionable etiology.  Patient with no source of infection noted.  Likely secondary to dehydration.  Blood cultures pending.  Urinalysis nitrite negative, leukocytes negative.  Chest x-ray negative for any acute infiltrates.  Patient with no significant respiratory symptoms.  Continue IV Rocephin empirically.  Follow.  6.  Lactic acidosis Likely secondary to dehydration.  Improved with hydration.  7.  Alcohol abuse/alcohol withdrawal/DTs Place patient on Librium detox protocol.  Continue IV Ativan as needed.  Continue thiamine, folic acid, multivitamin.  Follow.  8.  Chronic systolic heart failure/nonischemic cardiomyopathy Likely secondary to chronic alcohol abuse.  Currently  compensated.  Patient currently on IV fluids will need to monitor fluid status closely.  Decrease IV fluids to normal saline at 125 cc/h.  Diuretics, ARB, ACE inhibitor on hold secondary to acute renal failure.  Pericardial allergy.  9.  Thrombocytopenia Secondary to alcohol abuse.  10.  Anemia Likely secondary to alcohol abuse and dilutional.  Check an anemia panel.  Follow H&H.  Transfusion threshold hemoglobin less than 7.   DVT prophylaxis: Heparin Code Status: Full Family Communication: Updated patient.  No family at bedside. Disposition Plan: Remain in stepdown unit.  Likely home once electrolyte abnormalities have been corrected, acute renal failure resolved, no further withdrawal symptoms.   Consultants:   Cardiology: Dr. Rennis Golden 05/03/2017  PCCM admission: Dr. Vassie Loll 05/03/2017  Procedures:   Chest x-ray 05/03/2017  Antimicrobials:   IV Rocephin 05/03/2017   Subjective: Patient sleeping however easily arousable.  Patient states some improvement with cramping in his hands however still with some tremors.  No further emesis.  Some nausea.  No abdominal pain.  No chest pain.  No shortness of breath.  On Cardizem drip.  Objective: Vitals:   05/04/17 0500 05/04/17 0600 05/04/17 0700 05/04/17 0800  BP: 95/64 107/80 (!) 120/100 (!) 146/98  Pulse:  (!) 101 94 (!) 103  Resp: (!) 31   (!) 28  Temp:    97.8 F (36.6 C)  TempSrc:    Oral  SpO2:  96% 98% 98%  Weight:      Height:        Intake/Output Summary (Last 24 hours) at 05/04/2017 1109 Last data filed at 05/04/2017 0800 Gross per 24 hour  Intake 5303 ml  Output 1150 ml  Net 4153 ml   Filed Weights   05/03/17 1105  Weight: 123.8 kg (272 lb 14.9 oz)    Examination:  General exam: Tremors. Respiratory system: Clear to auscultation. Respiratory effort normal. Cardiovascular system: Irregularly irregular.  No JVD, murmurs, rubs, gallops or clicks. No pedal edema. Gastrointestinal system: Abdomen is nondistended,  soft and nontender. No organomegaly or masses felt. Normal bowel sounds heard. Central nervous system: Alert and oriented. No focal neurological deficits. Extremities: Symmetric 5 x 5 power. Skin: No rashes, lesions or ulcers Psychiatry: Judgement and insight appear fair. Mood & affect appropriate.     Data Reviewed: I have personally reviewed following labs and imaging studies  CBC: Recent Labs  Lab 05/03/17 1116 05/04/17 0533  WBC 31.8* 12.7*  NEUTROABS 26.4*  --   HGB 15.2 11.6*  HCT 42.0 32.3*  MCV 87.5 86.8  PLT 193 116*   Basic Metabolic Panel: Recent Labs  Lab 05/03/17 1116 05/03/17 1552 05/03/17 2051 05/03/17 2310 05/04/17 0533  NA 123* 123* 123*  --  124*  K 2.9* 3.0* 2.9*  --  3.2*  CL 76* 84* 85*  --  89*  CO2 21* 21* 22  --  21*  GLUCOSE 137* 118* 104*  --  121*  BUN 55* 58* 59*  --  47*  CREATININE 3.77* 3.59* 3.24*  --  2.23*  CALCIUM 5.8* 5.1* 5.1*  --  4.9*  MG 0.6* 3.2*  --  1.5* 1.9  PHOS  --  3.0  --   --  1.6*   GFR: Estimated Creatinine Clearance: 59.1 mL/min (A) (by C-G formula based on SCr of 2.23  mg/dL (H)). Liver Function Tests: Recent Labs  Lab 05/03/17 1116 05/04/17 0805  AST 66*  --   ALT 25  --   ALKPHOS 48  --   BILITOT 1.9*  --   PROT 8.3*  --   ALBUMIN 4.2 3.2*   Recent Labs  Lab 05/03/17 1116  LIPASE 161*   No results for input(s): AMMONIA in the last 168 hours. Coagulation Profile: Recent Labs  Lab 05/03/17 2051  INR 1.17   Cardiac Enzymes: Recent Labs  Lab 05/03/17 1552 05/04/17 0533  CKTOTAL 2,539* 3,001*   BNP (last 3 results) No results for input(s): PROBNP in the last 8760 hours. HbA1C: No results for input(s): HGBA1C in the last 72 hours. CBG: No results for input(s): GLUCAP in the last 168 hours. Lipid Profile: No results for input(s): CHOL, HDL, LDLCALC, TRIG, CHOLHDL, LDLDIRECT in the last 72 hours. Thyroid Function Tests: No results for input(s): TSH, T4TOTAL, FREET4, T3FREE, THYROIDAB in  the last 72 hours. Anemia Panel: Recent Labs    05/04/17 0533  RETICCTPCT 1.4   Sepsis Labs: Recent Labs  Lab 05/03/17 1156 05/03/17 1552  PROCALCITON  --  1.79  LATICACIDVEN 6.28* 1.7    Recent Results (from the past 240 hour(s))  MRSA PCR Screening     Status: Abnormal   Collection Time: 05/03/17  5:04 PM  Result Value Ref Range Status   MRSA by PCR INVALID RESULTS, SPECIMEN SENT FOR CULTURE (A) NEGATIVE Final    Comment:        The GeneXpert MRSA Assay (FDA approved for NASAL specimens only), is one component of a comprehensive MRSA colonization surveillance program. It is not intended to diagnose MRSA infection nor to guide or monitor treatment for MRSA infections.          Radiology Studies: Dg Chest Portable 1 View  Result Date: 05/03/2017 CLINICAL DATA:  Chest pain and SOB EXAM: PORTABLE CHEST 1 VIEW COMPARISON:  07/28/2015 FINDINGS: The heart size and mediastinal contours are within normal limits. Both lungs are clear. The visualized skeletal structures are unremarkable. IMPRESSION: No active disease. Electronically Signed   By: Signa Kell M.D.   On: 05/03/2017 11:41        Scheduled Meds: . calcium carbonate  2 tablet Oral TID PC & HS  . folic acid  1 mg Oral Daily  . heparin  5,000 Units Subcutaneous Q8H  . multivitamin with minerals  1 tablet Oral Daily  . pantoprazole  40 mg Oral Daily  . [START ON 05/05/2017] thiamine  100 mg Oral Daily   Continuous Infusions: . sodium chloride 150 mL/hr at 05/04/17 0700  . cefTRIAXone (ROCEPHIN)  IV Stopped (05/03/17 2247)  . diltiazem (CARDIZEM) infusion 5 mg/hr (05/04/17 0733)  . potassium PHOSPHATE IVPB (mmol)       LOS: 1 day    Time spent: 40 minutes    Ramiro Harvest, MD [facility] Pager [phone]  If 7PM-7AM, please contact night-coverage [email or web address] Password [facility] 05/04/2017, 11:09 AM

## 2017-05-04 NOTE — Progress Notes (Signed)
eLink Physician-Brief Progress Note Patient Name: Juan Mathews DOB: 1970-10-20 MRN: [id number]   Date of Service  05/04/2017  HPI/Events of Note  Low potassium and magnesium  eICU Interventions  Replacement ordered     Intervention Category Minor Interventions: Electrolytes abnormality - evaluation and management  Henry Russel, P 05/04/2017, 12:06 AM

## 2017-05-04 NOTE — Progress Notes (Signed)
PHARMACIST - PHYSICIAN COMMUNICATION  DR:  Janee Morn  CONCERNING: IV to Oral Route Change Policy  RECOMMENDATION: This patient is receiving Folic acid & Protonix by the intravenous route.  Based on criteria approved by the Pharmacy and Therapeutics Committee, the intravenous medication(s) is/are being converted to the equivalent oral dose form(s).   DESCRIPTION: These criteria include:  The patient is eating (either orally or via tube) and/or has been taking other orally administered medications for a least 24 hours  The patient has no evidence of active gastrointestinal bleeding or impaired GI absorption (gastrectomy, short bowel, patient on TNA or NPO).  If you have questions about this conversion, please contact the Pharmacy Department  []   [phone] )  [facility] []   [phone] )  [facility] []   [phone] )  [facility] []   [phone] )  [facility] [x]   [phone] )  [facility]   Emily Filbert [place], [facility] 05/04/2017 11:05 AM

## 2017-05-04 NOTE — Consult Note (Signed)
Renal Service Consult Note [facility]  Dian Minahan 05/04/2017 Maree Krabbe Requesting Physician:  Dr Janee Morn  Reason for Consult:  Renal failure, low Ca HPI: The patient is a 47 y.o. year-old with hx of CHF/ NICM and atrial fib presented to ED on 1/24 with CP and SOB, also was having muscle cramps in his hands and tingling w/ N/V.  In ED had AKI and hypocalcemia.  Mg was low at 0.6.  Creat 3.7.  ETOH withdrawal, hx etoh abuse.  Pt was admitted and rec'd BZD Rx, 3L NS bolus, IV KCL, IV KPhos, IV MgSO4 10 gm (4+4+2), IV dilt, IV rocephin, IV Ca 4 gm (1+ 1+2).  Creat down to 2.2 this am.  Cramps improving.  Mg up to 3.2, Ca 4.9 (down from 5.1). Corr Ca = 5.5.  Total CPK 3000.  Asked to see for AKI and hypoCa.   Urine lytes show UNa < 10, Uosm 475, Ucreat 105.  Renal US normal kidneys.   Pt feeling a bit better today.  No SOB or CP.  +hx diarrhea off and on.  NO abd pain.  Cramping better.  No hx renal disease.  Separated, preparing for divorce.  Not working , laid off last year, living off inheritance from mother who died last year.  +etoh, no tob.     Home meds: -lisinopril 2.5 qd/ aldactone 25 / lasix 40 qd/ toprol-xl 50 bid -advil prn -belsomra 10 hs prn/ xarelto 20 qd     ROS  denies CP  no joint pain   no HA  no blurry vision  no rash  no diarrhea  no nausea/ vomiting  no dysuria  no difficulty voiding  no change in urine color    Past Medical History  Past Medical History:  Diagnosis Date  . Chronic systolic CHF (congestive heart failure) (HCC) 07/28/2015  . DTs (delirium tremens) (HCC) 05/04/2017  . ETOH abuse   . Obesity (BMI 30-39.9)   . Persistent atrial fibrillation (HCC) 07/28/2015   Diagnosed 2016 in [place] went off treatment and moved here  CHA2DS2VASC score 1     Past Surgical History History reviewed. No pertinent surgical history. Family History  Family History  Problem Relation Age of Onset  . CAD Neg Hx        Parents   Social  History  reports that he has been smoking cigarettes.  he has never used smokeless tobacco. He reports that he drinks alcohol. He reports that he uses drugs. Drug: Marijuana. Allergies No Known Allergies Home medications Prior to Admission medications   Medication Sig Start Date End Date Taking? Authorizing Provider  furosemide (LASIX) 40 MG tablet Take 1 tablet (40 mg total) by mouth daily. 07/30/15  Yes Othella Boyer, MD  ibuprofen (ADVIL,MOTRIN) 200 MG tablet Take 400-600 mg by mouth every 6 (six) hours as needed for mild pain.   Yes [provider]  lisinopril (PRINIVIL,ZESTRIL) 2.5 MG tablet Take 2.5 mg by mouth daily.    Yes [provider]  metoprolol succinate (TOPROL-XL) 50 MG 24 hr tablet Take 50 mg by mouth 2 (two) times daily.    Yes [provider]  spironolactone (ALDACTONE) 25 MG tablet Take 1 tablet (25 mg total) by mouth daily. 07/30/15  Yes Othella Boyer, MD  Suvorexant (BELSOMRA) 10 MG TABS Take 10 mg by mouth at bedtime as needed (sleep).   Yes [provider]  XARELTO 20 MG TABS tablet Take 20 mg by mouth  daily. 07/19/15  Yes [provider]   Liver Function Tests Recent Labs  Lab 05/03/17 1116 05/04/17 0805  AST 66*  --   ALT 25  --   ALKPHOS 48  --   BILITOT 1.9*  --   PROT 8.3*  --   ALBUMIN 4.2 3.2*   Recent Labs  Lab 05/03/17 1116  LIPASE 161*   CBC Recent Labs  Lab 05/03/17 1116 05/04/17 0533  WBC 31.8* 12.7*  NEUTROABS 26.4*  --   HGB 15.2 11.6*  HCT 42.0 32.3*  MCV 87.5 86.8  PLT 193 116*   Basic Metabolic Panel Recent Labs  Lab 05/03/17 1116 05/03/17 1552 05/03/17 2051 05/04/17 0533  NA 123* 123* 123* 124*  K 2.9* 3.0* 2.9* 3.2*  CL 76* 84* 85* 89*  CO2 21* 21* 22 21*  GLUCOSE 137* 118* 104* 121*  BUN 55* 58* 59* 47*  CREATININE 3.77* 3.59* 3.24* 2.23*  CALCIUM 5.8* 5.1* 5.1* 4.9*  PHOS  --  3.0  --  1.6*   Iron/TIBC/Ferritin/ %Sat    Component Value Date/Time   IRON 17 (L)  05/04/2017 0805   TIBC 224 (L) 05/04/2017 0805   FERRITIN 1,171 (H) 05/04/2017 0805   IRONPCTSAT 8 (L) 05/04/2017 0805    Vitals:   05/04/17 1200 05/04/17 1300 05/04/17 1313 05/04/17 1400  BP: 139/69 (!) 81/48 112/85   Pulse: (!) 111 (!) 116 (!) 113   Resp: 19 (!) 25    Temp: 98 F (36.7 C)     TempSrc: Oral     SpO2: 96% 97%    Weight:    121.8 kg (268 lb 8.3 oz)  Height:       Exam Gen alert, somewhat disheveled, no distress No rash, cyanosis or gangrene Sclera anicteric, throat clear  No jvd or bruits Chest clear bilat RRR no MRG Abd soft ntnd no mass or ascites +bs GU normal male MS no joint effusions or deformity Ext no LE edema / no wounds or ulcers Neuro is alert, Ox 3 , nf  Na 124  K 3.2  CO2 21  BUN 47  Cr 2.23   Ca 4.9, corr Ca 5.6  Alb 3.2 UA 1/24 > cloudy, amber, 30 prot, 1.016, 0-5 rbc/ wbc UNa < 10, UCr 105 UOsm = 475 CK 3000,  Tsat 8% WBC 12k   Hb 11.6   plt 116 CT abd / pelv w contrast April '17 > reflux dye RA into IVC and hepatics c/w RHF; dilated IVC and pelvic veins with varices about the urinary bladder.   ECHO April 2017 >  LVEF 10- 15% EF Last creat's here 1.0 - 1.1  (Apr '17 - Mar '18)    Impression: 1  AKI - due to vol depletion/ nsaids/ ACEi most likely.  UA neg, renal US ok.  Creat better w IVF"s.  Cont IVF"s, no nsaids, no acei. Will follow.  2  Hypocalcemia - combination hypoMg, AKI, rhabdo.  Continue to replace w po/ IV as needed. Check vit D levels and PTH.   3  HypoMg - due to etoh abuse, malnutrition 4  Hypokalemia - due to etoh abuse, malnutrition 5  NICM 6  HTN - hold acei for now 7  Rhabdomyolysis - not severe, cont IVF's   Plan - as above  Vinson Moselle MD [employer] pager [phone]   05/04/2017, 2:39 PM

## 2017-05-04 NOTE — Progress Notes (Signed)
Progress Note  Patient Name: Juan Mathews Date of Encounter: 05/04/2017  Primary Cardiologist: Dr Donnie Aho   Subjective   Has been able to keep water down, on liquid diet. Not as agitated as yesterday. (per RN, IV problems, not sure if he is getting much Dilt in the gtt)  Inpatient Medications    Scheduled Meds: . folic acid  1 mg Intravenous Daily  . heparin  5,000 Units Subcutaneous Q8H  . multivitamin with minerals  1 tablet Oral Daily  . pantoprazole (PROTONIX) IV  40 mg Intravenous QHS  . potassium chloride  40 mEq Oral Once  . thiamine injection  100 mg Intravenous Daily   Continuous Infusions: . sodium chloride 150 mL/hr at 05/04/17 0700  . cefTRIAXone (ROCEPHIN)  IV Stopped (05/03/17 2247)  . diltiazem (CARDIZEM) infusion 5 mg/hr (05/04/17 0733)  . magnesium sulfate 1 - 4 g bolus IVPB    . potassium PHOSPHATE IVPB (mmol)     PRN Meds: LORazepam, zolpidem   Vital Signs    Vitals:   05/04/17 0405 05/04/17 0500 05/04/17 0600 05/04/17 0700  BP: (!) 110/57 95/64 107/80 (!) 120/100  Pulse: 100  (!) 101 94  Resp: (!) 28 (!) 31    Temp:      TempSrc:      SpO2: 90%  96% 98%  Weight:      Height:        Intake/Output Summary (Last 24 hours) at 05/04/2017 0745 Last data filed at 05/04/2017 0700 Gross per 24 hour  Intake 4908 ml  Output 1150 ml  Net 3758 ml   Filed Weights   05/03/17 1105  Weight: 272 lb 14.9 oz (123.8 kg)    Telemetry    Atrial fib, HR approx 100, improved from admission - Personally Reviewed  ECG    01/24,  Atrial fib, RVR, HR 176  - Personally Reviewed  Physical Exam   General: Well developed, well nourished, male appearing in no acute distress. Head: Normocephalic, atraumatic.  Neck: Supple without bruits, JVD not seen elevated, difficult to assess 2nd body habitus. Lungs:  Resp regular and unlabored, CTA. Heart: Irreg R&R, S1, S2, no S3, S4, or murmur; no rub. Abdomen: Soft, non-tender, non-distended with normoactive bowel  sounds. No hepatomegaly. No rebound/guarding. No obvious abdominal masses. Extremities: No clubbing, cyanosis, no edema. Distal pedal pulses are 2+ bilaterally. Neuro: Alert and oriented X 3. Moves all extremities spontaneously. Psych: Normal affect.  Labs    Hematology Recent Labs  Lab 05/03/17 1116 05/04/17 0533  WBC 31.8* 12.7*  RBC 4.80 3.72*  HGB 15.2 11.6*  HCT 42.0 32.3*  MCV 87.5 86.8  MCH 31.7 31.2  MCHC 36.2* 35.9  RDW 14.6 14.6  PLT 193 116*    Chemistry Recent Labs  Lab 05/03/17 1116 05/03/17 1552 05/03/17 2051 05/04/17 0533  NA 123* 123* 123* 124*  K 2.9* 3.0* 2.9* 3.2*  CL 76* 84* 85* 89*  CO2 21* 21* 22 21*  GLUCOSE 137* 118* 104* 121*  BUN 55* 58* 59* 47*  CREATININE 3.77* 3.59* 3.24* 2.23*  CALCIUM 5.8* 5.1* 5.1* 4.9*  PROT 8.3*  --   --   --   ALBUMIN 4.2  --   --   --   AST 66*  --   --   --   ALT 25  --   --   --   ALKPHOS 48  --   --   --   BILITOT 1.9*  --   --   --  GFRNONAA 18* 19* 22* 34*  GFRAA 21* 22* 25* 39*  ANIONGAP 26* 18* 16* 14     Cardiac EnzymesNo results for input(s): TROPONINI in the last 168 hours.  Recent Labs  Lab 05/03/17 1127  TROPIPOC 0.00     BNP Recent Labs  Lab 05/03/17 1116  BNP 98.5     Radiology    Dg Chest Portable 1 View  Result Date: 05/03/2017 CLINICAL DATA:  Chest pain and SOB EXAM: PORTABLE CHEST 1 VIEW COMPARISON:  07/28/2015 FINDINGS: The heart size and mediastinal contours are within normal limits. Both lungs are clear. The visualized skeletal structures are unremarkable. IMPRESSION: No active disease. Electronically Signed   By: Signa Kell M.D.   On: 05/03/2017 11:41     Cardiac Studies   NONE  Patient Profile     47 y.o. male w/ hx S-CHF, presumed NICM w/ EF 10-15% from persistent afib/ETOH, HTN, noncompliance (partly financial) was admitted 01/24 w/ alcohol withdrawal, AKI, severe metabolic derangements and afib with RVR (which is known and persistent).  Assessment & Plan      Principal Problem: 1.  Hypocalcemia syndrome - per IM  Active Problems: 2.  Chronic systolic CHF (congestive heart failure) (HCC) - Need daily weights, strict I/O - I/O +4 L since admit but he was dry - feel volume status is good now - will have to watch for volume overload - MD advise if repeat echo needed, EF 10-15% 07/2015 echo  3.  Persistent atrial fibrillation (HCC) - HR has improved w/ improvement in general medical condition - if able to keep down Kdur being given now, change to po metoprolol - if not, will change to IV metoprolol since BP is better and IV drip is problematic  4. Anticoagulation - no ongoing anticoag until pt can demonstrate compliance w/ ETOH cessation  5. DTs - this and other problems, per IM   SignedTheodore Demark , PA-C 7:45 AM 05/04/2017 Pager: [phone]

## 2017-05-05 DIAGNOSIS — N179 Acute kidney failure, unspecified: Secondary | ICD-10-CM

## 2017-05-05 LAB — CBC WITH DIFFERENTIAL/PLATELET
Basophils Absolute: 0 10*3/uL (ref 0.0–0.1)
Basophils Relative: 0 %
EOS ABS: 0 10*3/uL (ref 0.0–0.7)
EOS PCT: 0 %
HCT: 27.3 % — ABNORMAL LOW (ref 39.0–52.0)
Hemoglobin: 9.8 g/dL — ABNORMAL LOW (ref 13.0–17.0)
LYMPHS ABS: 1.1 10*3/uL (ref 0.7–4.0)
LYMPHS PCT: 15 %
MCH: 31.4 pg (ref 26.0–34.0)
MCHC: 35.9 g/dL (ref 30.0–36.0)
MCV: 87.5 fL (ref 78.0–100.0)
MONO ABS: 1.3 10*3/uL — AB (ref 0.1–1.0)
Monocytes Relative: 18 %
Neutro Abs: 4.7 10*3/uL (ref 1.7–7.7)
Neutrophils Relative %: 67 %
Platelets: 107 10*3/uL — ABNORMAL LOW (ref 150–400)
RBC: 3.12 MIL/uL — AB (ref 4.22–5.81)
RDW: 14.7 % (ref 11.5–15.5)
WBC: 7 10*3/uL (ref 4.0–10.5)

## 2017-05-05 LAB — MAGNESIUM: MAGNESIUM: 2.3 mg/dL (ref 1.7–2.4)

## 2017-05-05 LAB — PTH, INTACT AND CALCIUM
CALCIUM TOTAL (PTH): 4.5 mg/dL — AB (ref 8.7–10.2)
PTH: 53 pg/mL (ref 15–65)

## 2017-05-05 LAB — RENAL FUNCTION PANEL
ALBUMIN: 2.9 g/dL — AB (ref 3.5–5.0)
Anion gap: 10 (ref 5–15)
BUN: 20 mg/dL (ref 6–20)
CHLORIDE: 92 mmol/L — AB (ref 101–111)
CO2: 25 mmol/L (ref 22–32)
CREATININE: 1.18 mg/dL (ref 0.61–1.24)
Calcium: 5.7 mg/dL — CL (ref 8.9–10.3)
GFR calc Af Amer: >60 mL/min (ref >60)
GLUCOSE: 128 mg/dL — AB (ref 65–99)
Phosphorus: 1.4 mg/dL — ABNORMAL LOW (ref 2.5–4.6)
Potassium: 2.7 mmol/L — CL (ref 3.5–5.1)
Sodium: 127 mmol/L — ABNORMAL LOW (ref 135–145)

## 2017-05-05 LAB — PARATHYROID HORMONE, INTACT (NO CA): PTH: 55 pg/mL (ref 15–65)

## 2017-05-05 LAB — VITAMIN D 25 HYDROXY (VIT D DEFICIENCY, FRACTURES): VIT D 25 HYDROXY: 8.5 ng/mL — AB (ref 30.0–100.0)

## 2017-05-05 MED ORDER — SODIUM CHLORIDE 0.9 % IV SOLN
2.0000 g | Freq: Once | INTRAVENOUS | Status: DC
  Filled 2017-05-05: qty 20

## 2017-05-05 MED ORDER — POTASSIUM CHLORIDE 10 MEQ/100ML IV SOLN
10.0000 meq | INTRAVENOUS | Status: DC
  Administered 2017-05-05: 10 meq via INTRAVENOUS
  Filled 2017-05-05: qty 100

## 2017-05-05 MED ORDER — POTASSIUM CHLORIDE CRYS ER 20 MEQ PO TBCR
40.0000 meq | EXTENDED_RELEASE_TABLET | Freq: Once | ORAL | Status: AC
  Administered 2017-05-05: 40 meq via ORAL
  Filled 2017-05-05: qty 2

## 2017-05-05 MED ORDER — POTASSIUM CHLORIDE CRYS ER 20 MEQ PO TBCR: 40.0000 meq | EXTENDED_RELEASE_TABLET | Freq: Once | ORAL | Status: DC

## 2017-05-05 NOTE — Progress Notes (Signed)
At about 2130 05/04/2017 pt. IV was out upon assessment. Pt. Stated he wanted to leave. It was explained to him by myself and charge nurse what leaving AMA is and the risks included. Pt. Decided he will stay until the morning to speak with MD. IV team came and started IV on him.   At  about 0530 pt. demanded to have iv's taken out because they were "burning his arm and couldn't wait any longer for the doctor". Route of medication changed but pt refused care. Pt. Educated about the treatment he's receiving and insisted he no longer wanted to be here. Pt. took himself off the monitor and got dressed to leave. Charge nurse notified and came into room was unable to deescalate situation. Pt still refusing care and signed AMA form. Escorted by security. MD notified

## 2017-05-05 NOTE — Discharge Summary (Signed)
Physician Discharge Summary  Juan Mathews ZOX:[id number] DOB: [birth year] DOA: 05/03/2017  PCP: Hoyt Koch, MD  Admit date: 05/03/2017 Discharge date: 05/05/2017     PATIENT LEFT AGAINST MEDICAL ADVISE  Time spent: 25 minutes  Recommendations for Outpatient Follow-up:  1. Patient left AGAINST MEDICAL ADVICE.   Discharge Diagnoses:  Principal Problem:   Hypocalcemia syndrome Active Problems:   Persistent atrial fibrillation (HCC)   Chronic systolic CHF (congestive heart failure) (HCC)   Obesity (BMI 30-39.9)   Alcohol abuse with alcohol-induced mood disorder (HCC)   DTs (delirium tremens) (HCC)   Alcohol withdrawal syndrome with complication, with unspecified complication (HCC)   Hypomagnesemia   Hypophosphatemia   ARF (acute renal failure) (HCC)   Dehydration   Alcoholic cardiomyopathy (HCC)   Leukocytosis   Thrombocytopenia (HCC)   Anemia   Lactic acidosis   Alcohol withdrawal syndrome without complication (HCC)   Atrial fibrillation with rapid ventricular response (HCC)   Hypocalcemia   Hypokalemia   Discharge Condition:   Diet recommendation:   Filed Weights   05/03/17 1105 05/04/17 1400  Weight: 123.8 kg (272 lb 14.9 oz) 121.8 kg (268 lb 8.3 oz)    History of present illness:  See H&P.  Dr. Vassie Loll 05/03/2017  Hospital Course:   Patient left AGAINST MEDICAL ADVICE. Please see progress note from May 04, 2017 for hospital details until patient left AGAINST MEDICAL ADVICE.  Procedures:  Chest x-ray 05/03/2017  Renal ultrasound 05/04/2017      Consultations:  Cardiology: Dr. Rennis Golden 05/03/2017  PCCM admission: Dr. Vassie Loll 05/03/2017  Nephrology: Dr.Schertz 05/04/2017      Discharge Exam: Vitals:   05/05/17 0025 05/05/17 0400  BP: 133/87   Pulse: (!) 117   Resp: (!) 30   Temp:  98.5 F (36.9 C)  SpO2: 97%     General: Left AMA Cardiovascular: Left AMA Respiratory: Left AMA  Discharge Instructions    Allergies as of 05/05/2017   No  Known Allergies     Medication List    ASK your doctor about these medications   BELSOMRA 10 MG Tabs Generic drug:  Suvorexant Take 10 mg by mouth at bedtime as needed (sleep).   furosemide 40 MG tablet Commonly known as:  LASIX Take 1 tablet (40 mg total) by mouth daily.   ibuprofen 200 MG tablet Commonly known as:  ADVIL,MOTRIN Take 400-600 mg by mouth every 6 (six) hours as needed for mild pain.   lisinopril 2.5 MG tablet Commonly known as:  PRINIVIL,ZESTRIL Take 2.5 mg by mouth daily.   metoprolol succinate 50 MG 24 hr tablet Commonly known as:  TOPROL-XL Take 50 mg by mouth 2 (two) times daily.   spironolactone 25 MG tablet Commonly known as:  ALDACTONE Take 1 tablet (25 mg total) by mouth daily.   XARELTO 20 MG Tabs tablet Generic drug:  rivaroxaban Take 20 mg by mouth daily.      No Known Allergies    The results of significant diagnostics from this hospitalization (including imaging, microbiology, ancillary and laboratory) are listed below for reference.    Significant Diagnostic Studies: US Renal  Result Date: 05/04/2017 CLINICAL DATA:  47 year old male with acute renal insufficiency. EXAM: RENAL / URINARY TRACT ULTRASOUND COMPLETE COMPARISON:  CT Abdomen and Pelvis 07/28/2015 FINDINGS: Right Kidney: Length: 15.0 centimeters. Echogenicity within normal limits. No mass or hydronephrosis visualized. Left Kidney: Length: 14.4 centimeters. Echogenicity within normal limits. No mass or hydronephrosis visualized. Bladder: Appears normal for degree of bladder distention. Other findings: Increased echogenicity  of the visible right hepatic lobe suggesting steatosis (image 9). IMPRESSION: 1. Normal ultrasound appearance of both kidneys and the urinary bladder. 2. Fatty liver disease. Electronically Signed   By: Odessa Fleming M.D.   On: 05/04/2017 15:22   Dg Chest Portable 1 View  Result Date: 05/03/2017 CLINICAL DATA:  Chest pain and SOB EXAM: PORTABLE CHEST 1 VIEW  COMPARISON:  07/28/2015 FINDINGS: The heart size and mediastinal contours are within normal limits. Both lungs are clear. The visualized skeletal structures are unremarkable. IMPRESSION: No active disease. Electronically Signed   By: Signa Kell M.D.   On: 05/03/2017 11:41    Microbiology: Recent Results (from the past 240 hour(s))  Culture, blood (routine x 2)     Status: None (Preliminary result)   Collection Time: 05/03/17 11:39 AM  Result Value Ref Range Status   Specimen Description BLOOD RIGHT ANTECUBITAL  Final   Special Requests   Final    BOTTLES DRAWN AEROBIC AND ANAEROBIC Blood Culture adequate volume   Culture   Final    NO GROWTH 1 DAY Performed at [facility], [address], [place], [place] [address]    Report Status PENDING  Incomplete  Culture, blood (routine x 2)     Status: None (Preliminary result)   Collection Time: 05/03/17 11:44 AM  Result Value Ref Range Status   Specimen Description BLOOD LEFT ANTECUBITAL  Final   Special Requests   Final    BOTTLES DRAWN AEROBIC AND ANAEROBIC Blood Culture adequate volume   Culture   Final    NO GROWTH 1 DAY Performed at [facility], [address] [place], [place] [address]    Report Status PENDING  Incomplete  MRSA PCR Screening     Status: Abnormal   Collection Time: 05/03/17  5:04 PM  Result Value Ref Range Status   MRSA by PCR INVALID RESULTS, SPECIMEN SENT FOR CULTURE (A) NEGATIVE Final    Comment:        The GeneXpert MRSA Assay (FDA approved for NASAL specimens only), is one component of a comprehensive MRSA colonization surveillance program. It is not intended to diagnose MRSA infection nor to guide or monitor treatment for MRSA infections.   MRSA culture     Status: None (Preliminary result)   Collection Time: 05/03/17  5:04 PM  Result Value Ref Range Status   Specimen Description NOSE  Final   Special Requests NONE  Final   Culture   Final    CULTURE REINCUBATED FOR BETTER  GROWTH Performed at [facility], [address] [place], [place] [address]    Report Status PENDING  Incomplete     Labs: Basic Metabolic Panel: Recent Labs  Lab 05/03/17 1116 05/03/17 1552 05/03/17 2051 05/03/17 2310 05/04/17 0533 05/04/17 0805 05/05/17 0257  NA 123* 123* 123*  --  124*  --  127*  K 2.9* 3.0* 2.9*  --  3.2*  --  2.7*  CL 76* 84* 85*  --  89*  --  92*  CO2 21* 21* 22  --  21*  --  25  GLUCOSE 137* 118* 104*  --  121*  --  128*  BUN 55* 58* 59*  --  47*  --  20  CREATININE 3.77* 3.59* 3.24*  --  2.23*  --  1.18  CALCIUM 5.8* 5.1* 5.1*  --  4.9* 4.5* 5.7*  MG 0.6* 3.2*  --  1.5* 1.9  --  2.3  PHOS  --  3.0  --   --  1.6*  --  1.4*   Liver Function Tests: Recent Labs  Lab 05/03/17 1116 05/04/17 0805 05/05/17 0257  AST 66*  --   --   ALT 25  --   --   ALKPHOS 48  --   --   BILITOT 1.9*  --   --   PROT 8.3*  --   --   ALBUMIN 4.2 3.2* 2.9*   Recent Labs  Lab 05/03/17 1116  LIPASE 161*   No results for input(s): AMMONIA in the last 168 hours. CBC: Recent Labs  Lab 05/03/17 1116 05/04/17 0533 05/05/17 0257  WBC 31.8* 12.7* 7.0  NEUTROABS 26.4*  --  4.7  HGB 15.2 11.6* 9.8*  HCT 42.0 32.3* 27.3*  MCV 87.5 86.8 87.5  PLT 193 116* 107*   Cardiac Enzymes: Recent Labs  Lab 05/03/17 1552 05/04/17 0533  CKTOTAL 2,539* 3,001*   BNP: BNP (last 3 results) Recent Labs    05/03/17 1116  BNP 98.5    ProBNP (last 3 results) No results for input(s): PROBNP in the last 8760 hours.  CBG: No results for input(s): GLUCAP in the last 168 hours.     Signed:  Ramiro Harvest MD.  [facility] 05/05/2017, 9:18 AM

## 2017-05-05 NOTE — Progress Notes (Signed)
CRITICAL VALUE ALERT  Critical Value:  K 2.7, Ca (BMP) 5.7, Ca (intact PPH) 4.5  Date & Time Notied:  05/05/2017 @0426   Provider Notified: YES text-paged MD Bodenheimer  Orders Received/Actions taken: Awaiting orders. Will continue to monitor

## 2017-05-06 LAB — MRSA CULTURE: Culture: NOT DETECTED

## 2017-05-07 LAB — CALCITRIOL (1,25 DI-OH VIT D): VIT D 1 25 DIHYDROXY: 34.3 pg/mL (ref 19.9–79.3)

## 2017-05-08 LAB — CULTURE, BLOOD (ROUTINE X 2)
Culture: NO GROWTH
Culture: NO GROWTH
Special Requests: ADEQUATE
Special Requests: ADEQUATE

## 2017-05-13 ENCOUNTER — Telehealth: Payer: Self-pay | Admitting: *Deleted

## 2017-05-13 NOTE — Telephone Encounter (Signed)
-----   Message from Sherrilyn Rist sent at 03/23/2017 11:00 AM EST ----- Regarding: RE: a fib consult Fyi..   11/19 Cancel Rsn: Patient (pt will call later to reschedule appt-[id number])   appt with Dr. Johney Frame on  11/21 . gesila     ----- Message ----- From: Hillis Range, MD Sent: 03/19/2017   9:18 AM To: Melissa A Tatum Subject: FW: a fib consult                              Amber was supposed to forward to you.  I don't know if she did or not. Please schedule to see me soon.  Tx!  ----- Message ----- From: Othella Boyer, MD Sent: 03/14/2017  10:58 AM To: Hillis Range, MD Subject: RE: a fib consult                              Whatever came of the consult for Juan Mathews?  ----- Message ----- From: Hillis Range, MD Sent: 01/20/2017   8:51 PM To: Othella Boyer, MD, Marily Lente, NP Subject: RE: a fib consult                              Will do! Thanks!  ----- Message ----- From: Othella Boyer, MD Sent: 01/18/2017   3:19 PM To: Hillis Range, MD, Marily Lente, NP Subject: a fib consult                                  [name],  This is a tough one.  Can you call him and see him.  I think he is willing to have something done now but I hope it is not too late.  Am getting another ECHO on him now.   Thanks,  [employer]

## 2017-10-01 IMAGING — CR DG CHEST 2V
2 series · 2 of 2 positions shown · non-contrast
Comparison: None.

CLINICAL DATA: Shortness of breath for several days.

EXAM:
CHEST  2 VIEW

[w chest pa]
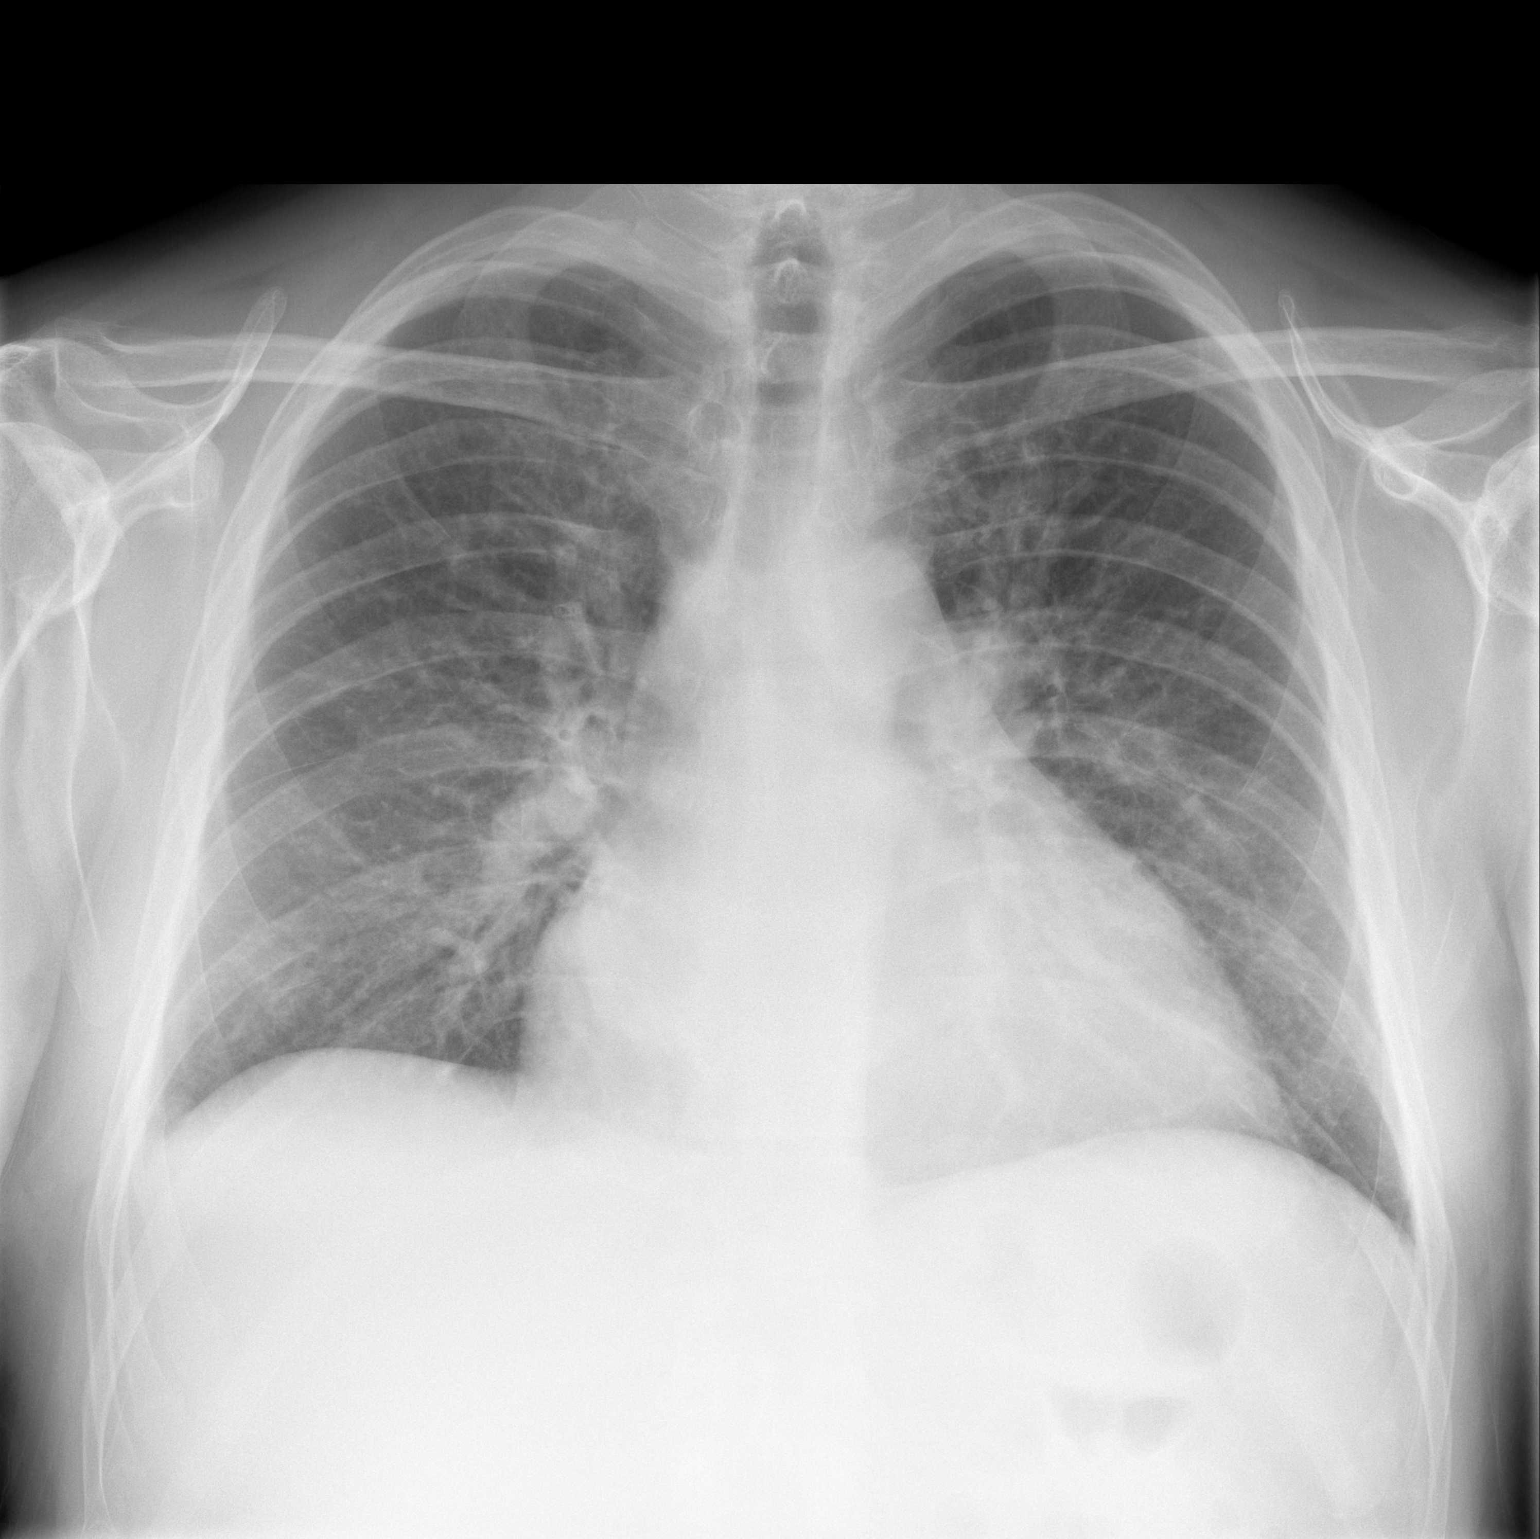

[w chest lat]
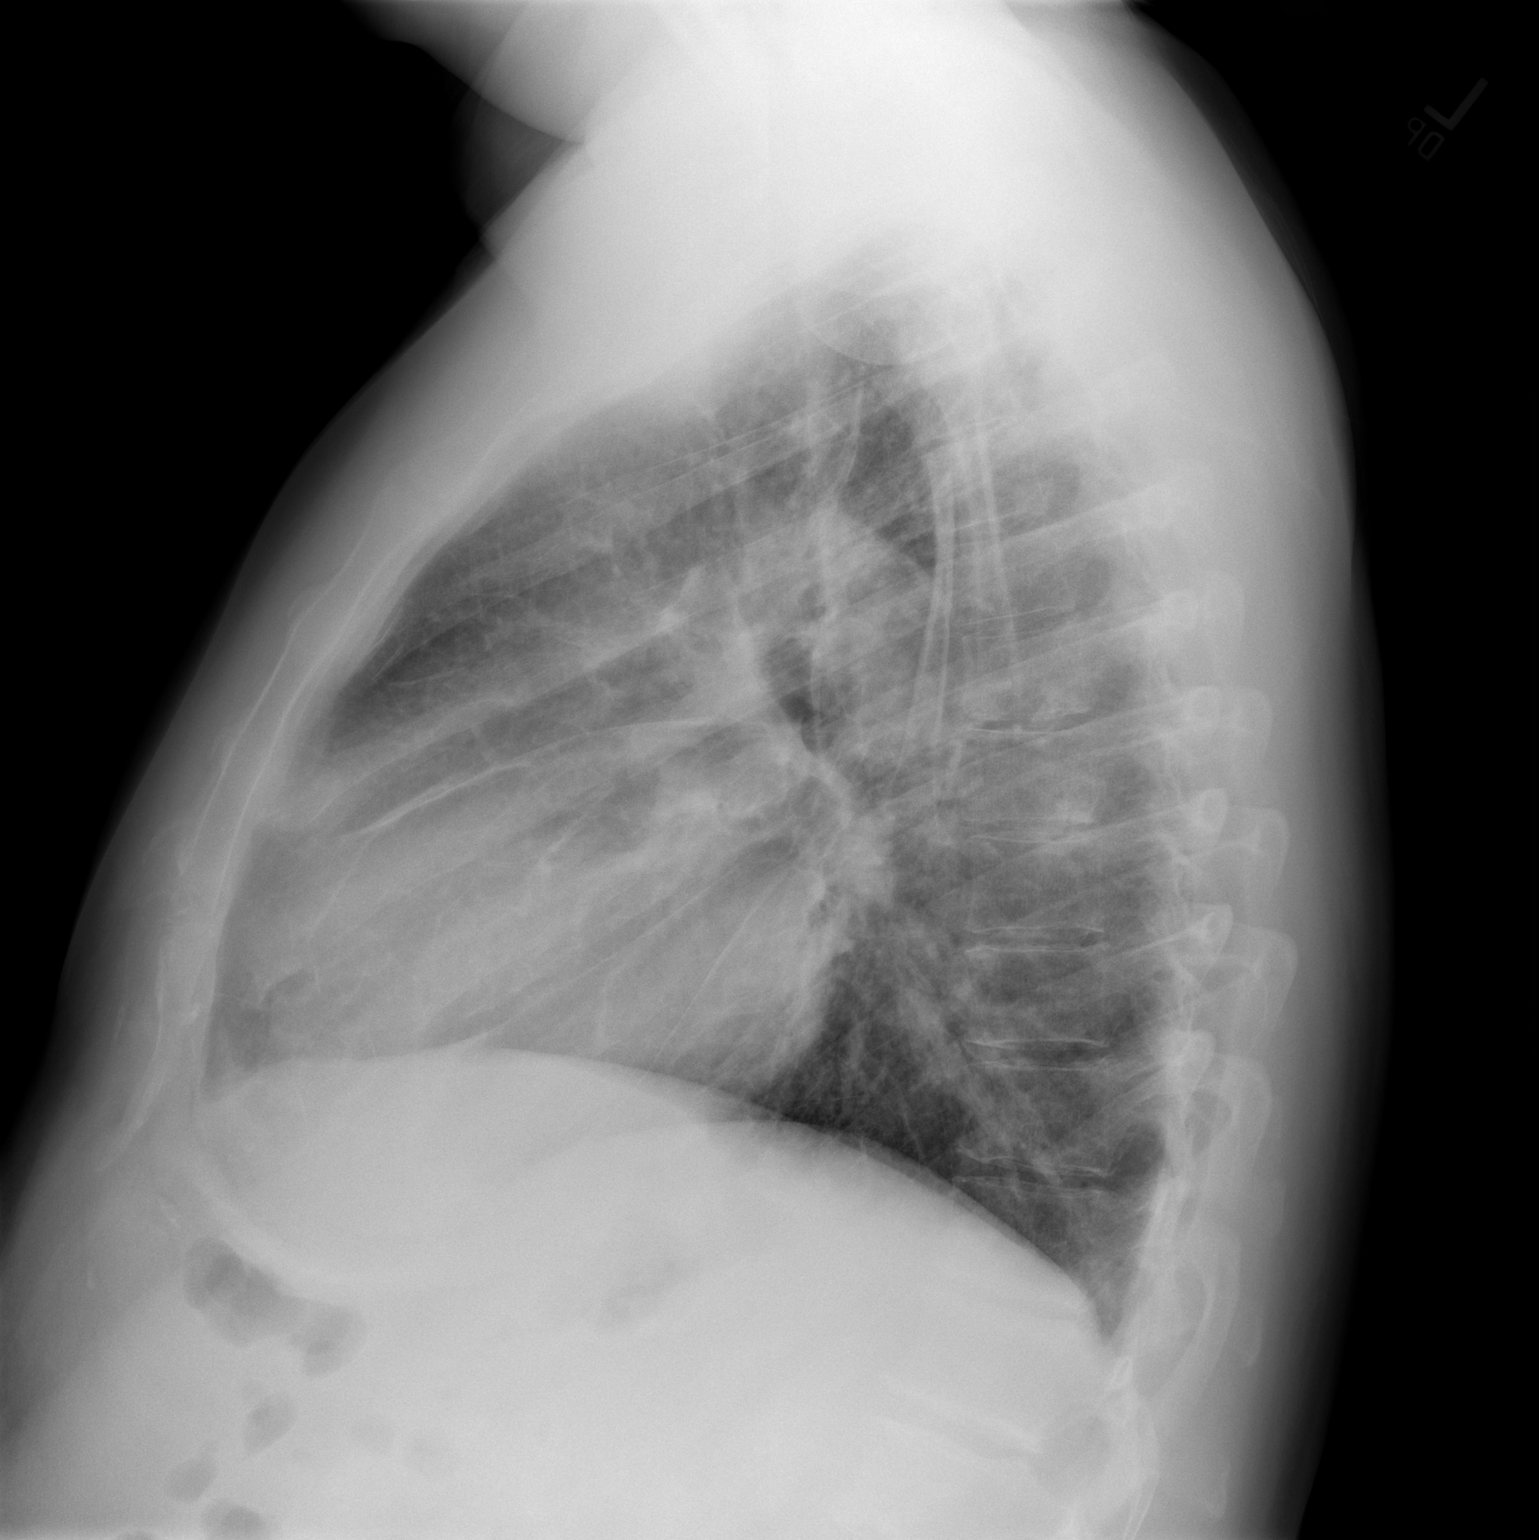

[2 of 2 positions shown; findings below may reference images not displayed]

FINDINGS: Cardiomegaly with vascular congestion. No overt edema. No confluent
opacities or effusions. No acute bony abnormality.
IMPRESSION: Cardiomegaly with vascular congestion.

## 2018-04-05 ENCOUNTER — Other Ambulatory Visit: Payer: Self-pay | Admitting: Cardiology

## 2018-04-05 NOTE — Telephone Encounter (Signed)
° °  1. Which medications need to be refilled? (please list name of each medication and dose if known) Lisinopril 2.5mg  tablet 1QD  2. Which pharmacy/location (including street and city if local pharmacy) is medication [facility] be sent to?[facility] [place] [address] [facility]  3. Do they need a 30 day or 90 day supply? 30

## 2018-04-08 MED ORDER — LISINOPRIL 2.5 MG PO TABS: 2.5000 mg | ORAL_TABLET | Freq: Every day | ORAL | 0 refills | Status: DC

## 2018-04-08 NOTE — Telephone Encounter (Signed)
Lisinopril 2.5 mg daily refilled per Dr. Bing Matter.

## 2018-04-08 NOTE — Telephone Encounter (Signed)
Has been given to Dr. Krasowski for him to review  

## 2018-05-04 ENCOUNTER — Other Ambulatory Visit: Payer: Self-pay | Admitting: Cardiology

## 2018-05-08 ENCOUNTER — Telehealth: Payer: Self-pay | Admitting: Cardiology

## 2018-05-08 MED ORDER — SPIRONOLACTONE 25 MG PO TABS: 25.0000 mg | ORAL_TABLET | Freq: Every day | ORAL | 0 refills | Status: AC

## 2018-05-08 NOTE — Telephone Encounter (Signed)
30 day refill has been sent to pharmacy. Patient is over due for a follow up and needs to scheduled for a follow up.

## 2018-05-08 NOTE — Telephone Encounter (Signed)
1. Which medications need to be refilled? (please list name of each medication and dose if known) Spironolactone 25mg  tablet once daily  2. Which pharmacy/location (including street and city if local pharmacy) is medication to be sent to?[facility] [phone] [place]  3. Do they need a 30 day or 90 day supply? 90

## 2018-05-22 ENCOUNTER — Other Ambulatory Visit: Payer: Self-pay | Admitting: Cardiology

## 2018-05-23 ENCOUNTER — Other Ambulatory Visit: Payer: Self-pay | Admitting: Cardiology

## 2018-05-30 ENCOUNTER — Other Ambulatory Visit: Payer: Self-pay | Admitting: Cardiology

## 2018-08-06 ENCOUNTER — Telehealth: Payer: Self-pay | Admitting: Cardiology

## 2018-08-06 MED ORDER — DIGOXIN 125 MCG PO TABS: 125.0000 ug | ORAL_TABLET | Freq: Every day | ORAL | 0 refills | Status: AC

## 2018-08-06 MED ORDER — DIGOXIN 125 MCG PO TABS
125.0000 ug | ORAL_TABLET | Freq: Every day | ORAL | 0 refills | Status: DC
Start: 2018-08-06 — End: 2018-08-06

## 2018-08-06 NOTE — Addendum Note (Signed)
Addended by: Lamona Curl on: 08/06/2018 03:02 PM   Modules accepted: Orders

## 2018-08-06 NOTE — Telephone Encounter (Signed)
°*  STAT* If patient is at the pharmacy, call can be transferred to refill team.   1. Which medications need to be refilled? (please list name of each medication and dose if known) Digoxin 2. Which pharmacy/location (including street and city if local pharmacy) is medication to be sent to?  [facility] [id number] [place] [address] [place] - [address] [phone] (Phone) [phone] (Fax)     3. Do they need a 30 day or 90 day supply? 90day

## 2018-08-06 NOTE — Addendum Note (Signed)
Addended by: Lamona Curl on: 08/06/2018 03:04 PM   Modules accepted: Orders

## 2018-08-06 NOTE — Telephone Encounter (Signed)
Patient is overdue for follow up.  Multiple attempts to get patient to schedule follow up appointment have been made since Dec 2019.   He will be given 15 day supply.  Paula left message on his VM to contact office to schedule.  Note on Rx asking patient to return call to schedule appointment as well.

## 2018-10-09 DEATH — deceased
